# Patient Record
Sex: Male | Born: 1975 | Race: Black or African American | Hispanic: No | Marital: Single | Smoking: Current every day smoker
Health system: Southern US, Community
[De-identification: ages and names within clinical notes are randomized; demographics above are authoritative.]

## PROBLEM LIST (undated history)

## (undated) ENCOUNTER — Ambulatory Visit (HOSPITAL_COMMUNITY): Admission: EM | Payer: BC Managed Care – PPO

## (undated) DIAGNOSIS — A599 Trichomoniasis, unspecified: Secondary | ICD-10-CM

## (undated) DIAGNOSIS — J45909 Unspecified asthma, uncomplicated: Secondary | ICD-10-CM

---

## 1998-01-22 ENCOUNTER — Emergency Department (HOSPITAL_COMMUNITY): Admission: EM | Admit: 1998-01-22 | Discharge: 1998-01-22 | Payer: Self-pay | Admitting: Emergency Medicine

## 1998-03-15 ENCOUNTER — Emergency Department (HOSPITAL_COMMUNITY): Admission: EM | Admit: 1998-03-15 | Discharge: 1998-03-15 | Payer: Self-pay | Admitting: Emergency Medicine

## 1998-03-17 ENCOUNTER — Emergency Department (HOSPITAL_COMMUNITY): Admission: EM | Admit: 1998-03-17 | Discharge: 1998-03-17 | Payer: Self-pay | Admitting: Emergency Medicine

## 2000-05-11 ENCOUNTER — Emergency Department (HOSPITAL_COMMUNITY): Admission: EM | Admit: 2000-05-11 | Discharge: 2000-05-11 | Payer: Self-pay | Admitting: Emergency Medicine

## 2000-08-20 ENCOUNTER — Emergency Department (HOSPITAL_COMMUNITY): Admission: EM | Admit: 2000-08-20 | Discharge: 2000-08-20 | Payer: Self-pay | Admitting: *Deleted

## 2000-08-31 ENCOUNTER — Emergency Department (HOSPITAL_COMMUNITY): Admission: EM | Admit: 2000-08-31 | Discharge: 2000-08-31 | Payer: Self-pay | Admitting: Emergency Medicine

## 2002-04-01 ENCOUNTER — Emergency Department (HOSPITAL_COMMUNITY): Admission: EM | Admit: 2002-04-01 | Discharge: 2002-04-01 | Payer: Self-pay | Admitting: Emergency Medicine

## 2004-12-08 ENCOUNTER — Emergency Department (HOSPITAL_COMMUNITY): Admission: EM | Admit: 2004-12-08 | Discharge: 2004-12-08 | Payer: Self-pay | Admitting: Emergency Medicine

## 2005-02-06 ENCOUNTER — Emergency Department (HOSPITAL_COMMUNITY): Admission: EM | Admit: 2005-02-06 | Discharge: 2005-02-06 | Payer: Self-pay | Admitting: Emergency Medicine

## 2006-02-23 ENCOUNTER — Emergency Department (HOSPITAL_COMMUNITY): Admission: EM | Admit: 2006-02-23 | Discharge: 2006-02-23 | Payer: Self-pay | Admitting: Family Medicine

## 2006-03-23 ENCOUNTER — Emergency Department (HOSPITAL_COMMUNITY): Admission: EM | Admit: 2006-03-23 | Discharge: 2006-03-23 | Payer: Self-pay | Admitting: Family Medicine

## 2006-12-07 ENCOUNTER — Emergency Department (HOSPITAL_COMMUNITY): Admission: EM | Admit: 2006-12-07 | Discharge: 2006-12-07 | Payer: Self-pay | Admitting: Family Medicine

## 2008-06-05 ENCOUNTER — Emergency Department (HOSPITAL_COMMUNITY): Admission: EM | Admit: 2008-06-05 | Discharge: 2008-06-05 | Payer: Self-pay | Admitting: Family Medicine

## 2008-07-29 ENCOUNTER — Emergency Department (HOSPITAL_COMMUNITY): Admission: EM | Admit: 2008-07-29 | Discharge: 2008-07-29 | Payer: Self-pay | Admitting: Family Medicine

## 2008-08-02 ENCOUNTER — Emergency Department (HOSPITAL_COMMUNITY): Admission: EM | Admit: 2008-08-02 | Discharge: 2008-08-02 | Payer: Self-pay | Admitting: Emergency Medicine

## 2008-08-04 ENCOUNTER — Emergency Department (HOSPITAL_COMMUNITY): Admission: EM | Admit: 2008-08-04 | Discharge: 2008-08-04 | Payer: Self-pay | Admitting: Emergency Medicine

## 2008-10-02 ENCOUNTER — Emergency Department (HOSPITAL_COMMUNITY): Admission: EM | Admit: 2008-10-02 | Discharge: 2008-10-02 | Payer: Self-pay | Admitting: Emergency Medicine

## 2008-12-25 ENCOUNTER — Emergency Department (HOSPITAL_COMMUNITY): Admission: EM | Admit: 2008-12-25 | Discharge: 2008-12-25 | Payer: Self-pay | Admitting: Emergency Medicine

## 2010-05-17 ENCOUNTER — Emergency Department (HOSPITAL_COMMUNITY): Admission: EM | Admit: 2010-05-17 | Discharge: 2010-05-17 | Payer: Self-pay | Admitting: Emergency Medicine
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
## 2010-10-10 LAB — POCT URINALYSIS DIP (DEVICE)
Glucose, UA: NEGATIVE mg/dL
Hgb urine dipstick: NEGATIVE
Nitrite: NEGATIVE
Protein, ur: >=300 mg/dL — AB
Urobilinogen, UA: 1 mg/dL (ref 0.0–1.0)

## 2011-08-19 ENCOUNTER — Emergency Department (HOSPITAL_COMMUNITY)
Admission: EM | Admit: 2011-08-19 | Discharge: 2011-08-19 | Disposition: A | Discharge status: Home / Self Care | Payer: Self-pay | Source: Home / Self Care | Attending: Emergency Medicine | Admitting: Emergency Medicine

## 2011-08-19 ENCOUNTER — Encounter (HOSPITAL_COMMUNITY): Payer: Self-pay | Admitting: *Deleted

## 2011-08-19 DIAGNOSIS — Z202 Contact with and (suspected) exposure to infections with a predominantly sexual mode of transmission: Secondary | ICD-10-CM

## 2011-08-19 DIAGNOSIS — N481 Balanitis: Secondary | ICD-10-CM

## 2011-08-19 DIAGNOSIS — N476 Balanoposthitis: Secondary | ICD-10-CM

## 2011-08-19 HISTORY — DX: Trichomoniasis, unspecified: A59.9

## 2011-08-19 LAB — RPR: RPR Ser Ql: NONREACTIVE

## 2011-08-19 NOTE — ED Notes (Addendum)
Per pt girlfriend checked yesterday for STD treated pending results - pt wants to be checked std unprotected intercourse - denies penile discharge or urinary symptoms

## 2011-08-19 NOTE — ED Provider Notes (Signed)
Chief Complaint  Patient presents with  . SEXUALLY TRANSMITTED DISEASE    History of Present Illness:  Last week, the patient's girlfriend was diagnosed as having some abnormal cells in her cervix. She was tested for Chlamydia and gonorrhea and treated for these as well. As far as the patient knows her test did not come back positive. He himself has no symptoms including no urethral discharge, dysuria, or penile pain. He has a couple of red bumps on his penis have been there for months. They're not painful or irritated. He denies any blisters or ulcerations. No inguinal adenopathy. He's had no fever, chills, sore throat, skin rash, or joint pain. No prior history of STDs in the past.  Review of Systems:  Other than noted above, the patient denies any of the following symptoms: Systemic:  No fevers chills, aches, weight loss, arthralgias, myalgias, or adenopathy. GI:  No abdominal pain, nausea or vomiting. GU:  No dysuria, penile pain, discharge, itching, dysuria, genital lesions, testicular pain or swelling. Skin:  No rash or itching.  PMFSH:  Past medical history, family history, social history, meds, and allergies were reviewed.  Physical Exam:   Vital signs:  BP 127/77  Pulse 82  Temp(Src) 98.6 F (37 C) (Oral)  Resp 14  SpO2 100% Gen:  Alert, oriented, in no distress. Abdomen:  Soft and flat, non-distended, and non-tender.  No organomegaly or mass. Genital:  He has to small, round, slightly raised, plaque-like lesions on the glans penis. There were no ulcerations or blisters. No urethral discharge or penile pain. There was no inguinal adenopathy. No testicular tenderness, swelling, or mass. Skin:  Warm and dry.  No rash.    Assessment:   Diagnoses that have been ruled out:  None  Diagnoses that are still under consideration:  None  Final diagnoses:  Exposure to STD  Balanitis    Plan:   1.  The following meds were prescribed:   New Prescriptions   No medications on file    2.  The patient was instructed in symptomatic care and handouts were given. 3.  The patient was told to return if becoming worse in any way, if no better in 3 or 4 days, and given some red flag symptoms that would indicate earlier return. 4.  The patient was instructed to inform all sexual contacts, avoid intercourse completely for 2 weeks and then only with a condom.  The patient was told that we would call about all abnormal lab results, and that we would need to report certain kinds of infection to the health department.    Roque Lias, MD 08/19/11 [phone]

## 2011-08-19 NOTE — Discharge Instructions (Signed)
Balanitis Balanitis is an common infection of the head (glans) of the penis. CAUSES  Balanitis has multiple causes. Frequently balanitis is the result of poor personal hygiene. Especially if no circumcision has been done. Without adequate washing, many different kinds of germs (viruses, bacteria, and yeast) collect between the foreskin and the glans. This can cause an infection. Lack of air and irritation from a normal secretion called smegma contribute to the cause in uncircumcised males. Other causes include chemical irritation by certain soaps (especially soaps with perfumes). When no circumcision has been done, a frequent cause of poor hygiene is that the tip of the foreskin is tight (phimosis) and cannot be pulled back for adequate washing. Illnesses in other areas of the body can also cause balanitis. This includes illnesses that cause water retention and swelling, such as:  Heart failure.   Cirrhosis of the liver.   Kidney problems.  Other contributing causes include:  Obesity.   Certain allergies to drugs such as tetracycline and sulfa.   Diabetes.  SYMPTOMS  Symptoms may include:  Discharge coming from under the foreskin.   Tenderness.   Itching and inability to get an erection (because of the pain).   Redness and a rash is frequently seen.   If the problem remains for a while, sores can be seen on the glans and on the foreskin.  If the condition is not treated other complications such as a scar of the opening to the urethra (tube that carries the urine out from the bladder) can occur and block the bladder. This narrowing is called meatal stenosis. Other problems can occur such as:  Infection of the lymph nodes in the crease of the groin.   Ballooning of the foreskin when voiding (when the foreskin opening has scarred down and been made smaller).   Blockage of the bladder.   Frequent urinary infections occur in children with balanitis.  HOME CARE INSTRUCTIONS   Pull  back foreskin to urinate and when washing.   Pull back foreskin when putting medication on the affected area to prevent the foreskin from swelling and being trapped behind the head.   Keep foreskin and glans clean and dry.   Sitz baths may be helpful.   Take your medication as directed .   Pain medication, if needed.   Circumcision (may be recommended).  SEEK IMMEDIATE MEDICAL CARE IF:   The affected area becomes trapped behind the head.   You start a fever.   The swelling increases.  Document Released: 11/05/2008 Document Revised: 03/01/2011 Document Reviewed: 11/05/2008 [facility] Patient Information 2012 [place], [place].  You have been diagnosed with a possible STD.  Your results should be back in 3 days.  You can call us here at [phone] and ask for [facility].  She can tell you whether or not your results are back, but you must come here to get your results.  We do this to protect our patients' confidentiality.  You can come Monday through Friday and tell the receptionist that your are just here to get test results.  In the meantime, you should avoid intercourse altogether for 1 week.  After that, you should always use condoms--100% of the time.  This will not only prevent pregnancy, but has been shown to prevent HIV, syphilis, gonorrhea, chlamydia, hepatis C and other STDs.  If your test comes back positive, we are required by law to report it to the Health Department.  We also suggest you inform your partner or partners so they  can get tested and treated as well.

## 2011-08-21 LAB — GC/CHLAMYDIA PROBE AMP, GENITAL: GC Probe Amp, Genital: NEGATIVE

## 2012-07-31 ENCOUNTER — Emergency Department (HOSPITAL_COMMUNITY)
Admission: EM | Admit: 2012-07-31 | Discharge: 2012-07-31 | Disposition: A | Discharge status: Home / Self Care | Payer: Self-pay | Attending: Emergency Medicine | Admitting: Emergency Medicine

## 2012-07-31 ENCOUNTER — Encounter (HOSPITAL_COMMUNITY): Payer: Self-pay

## 2012-07-31 DIAGNOSIS — IMO0002 Reserved for concepts with insufficient information to code with codable children: Secondary | ICD-10-CM | POA: Insufficient documentation

## 2012-07-31 DIAGNOSIS — Z8619 Personal history of other infectious and parasitic diseases: Secondary | ICD-10-CM | POA: Insufficient documentation

## 2012-07-31 DIAGNOSIS — Y92410 Unspecified street and highway as the place of occurrence of the external cause: Secondary | ICD-10-CM | POA: Insufficient documentation

## 2012-07-31 DIAGNOSIS — M62838 Other muscle spasm: Secondary | ICD-10-CM

## 2012-07-31 DIAGNOSIS — F17200 Nicotine dependence, unspecified, uncomplicated: Secondary | ICD-10-CM | POA: Insufficient documentation

## 2012-07-31 DIAGNOSIS — Y9389 Activity, other specified: Secondary | ICD-10-CM | POA: Insufficient documentation

## 2012-07-31 MED ORDER — CYCLOBENZAPRINE HCL 10 MG PO TABS: 10.0000 mg | ORAL_TABLET | Freq: Two times a day (BID) | ORAL | Status: DC | PRN

## 2012-07-31 MED ORDER — HYDROCODONE-ACETAMINOPHEN 5-325 MG PO TABS: 1.0000 | ORAL_TABLET | ORAL | Status: DC | PRN

## 2012-07-31 MED ORDER — IBUPROFEN 800 MG PO TABS: 800.0000 mg | ORAL_TABLET | Freq: Three times a day (TID) | ORAL | Status: DC
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
## 2012-07-31 NOTE — ED Provider Notes (Signed)
Medical screening examination/treatment/procedure(s) were performed by non-physician practitioner and as supervising physician I was immediately available for consultation/collaboration.   Iman Reinertsen L Glendene Wyer, MD 07/31/12 1809 

## 2013-09-02 ENCOUNTER — Encounter (HOSPITAL_COMMUNITY): Payer: Self-pay | Admitting: Emergency Medicine

## 2013-09-02 ENCOUNTER — Emergency Department (HOSPITAL_COMMUNITY): Payer: Self-pay

## 2013-09-02 ENCOUNTER — Emergency Department (HOSPITAL_COMMUNITY)
Admission: EM | Admit: 2013-09-02 | Discharge: 2013-09-02 | Disposition: A | Discharge status: Home / Self Care | Payer: No Typology Code available for payment source | Attending: Emergency Medicine | Admitting: Emergency Medicine

## 2013-09-02 DIAGNOSIS — M549 Dorsalgia, unspecified: Secondary | ICD-10-CM

## 2013-09-02 DIAGNOSIS — F17200 Nicotine dependence, unspecified, uncomplicated: Secondary | ICD-10-CM | POA: Insufficient documentation

## 2013-09-02 DIAGNOSIS — Z791 Long term (current) use of non-steroidal anti-inflammatories (NSAID): Secondary | ICD-10-CM | POA: Insufficient documentation

## 2013-09-02 DIAGNOSIS — Y9389 Activity, other specified: Secondary | ICD-10-CM | POA: Insufficient documentation

## 2013-09-02 DIAGNOSIS — Y92410 Unspecified street and highway as the place of occurrence of the external cause: Secondary | ICD-10-CM | POA: Insufficient documentation

## 2013-09-02 DIAGNOSIS — IMO0002 Reserved for concepts with insufficient information to code with codable children: Secondary | ICD-10-CM | POA: Insufficient documentation

## 2013-09-02 DIAGNOSIS — Z8619 Personal history of other infectious and parasitic diseases: Secondary | ICD-10-CM | POA: Insufficient documentation

## 2013-09-02 MED ORDER — CYCLOBENZAPRINE HCL 10 MG PO TABS: 10.0000 mg | ORAL_TABLET | Freq: Two times a day (BID) | ORAL | Status: DC | PRN
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
## 2013-09-02 NOTE — Discharge Instructions (Signed)
Back Pain, Adult Low back pain is very common. About 1 in 5 people have back pain.The cause of low back pain is rarely dangerous. The pain often gets better over time.About half of people with a sudden onset of back pain feel better in just 2 weeks. About 8 in 10 people feel better by 6 weeks.  CAUSES Some common causes of back pain include:  Strain of the muscles or ligaments supporting the spine.  Wear and tear (degeneration) of the spinal discs.  Arthritis.  Direct injury to the back. DIAGNOSIS Most of the time, the direct cause of low back pain is not known.However, back pain can be treated effectively even when the exact cause of the pain is unknown.Answering your caregiver's questions about your overall health and symptoms is one of the most accurate ways to make sure the cause of your pain is not dangerous. If your caregiver needs more information, he or she may order lab work or imaging tests (X-rays or MRIs).However, even if imaging tests show changes in your back, this usually does not require surgery. HOME CARE INSTRUCTIONS For many people, back pain returns.Since low back pain is rarely dangerous, it is often a condition that people can learn to manageon their own.   Remain active. It is stressful on the back to sit or stand in one place. Do not sit, drive, or stand in one place for more than 30 minutes at a time. Take short walks on level surfaces as soon as pain allows.Try to increase the length of time you walk each day.  Do not stay in bed.Resting more than 1 or 2 days can delay your recovery.  Do not avoid exercise or work.Your body is made to move.It is not dangerous to be active, even though your back may hurt.Your back will likely heal faster if you return to being active before your pain is gone.  Pay attention to your body when you bend and lift. Many people have less discomfortwhen lifting if they bend their knees, keep the load close to their bodies,and  avoid twisting. Often, the most comfortable positions are those that put less stress on your recovering back.  Find a comfortable position to sleep. Use a firm mattress and lie on your side with your knees slightly bent. If you lie on your back, put a pillow under your knees.  Only take over-the-counter or prescription medicines as directed by your caregiver. Over-the-counter medicines to reduce pain and inflammation are often the most helpful.Your caregiver may prescribe muscle relaxant drugs.These medicines help dull your pain so you can more quickly return to your normal activities and healthy exercise.  Put ice on the injured area.  Put ice in a plastic bag.  Place a towel between your skin and the bag.  Leave the ice on for 15-20 minutes, 03-04 times a day for the first 2 to 3 days. After that, ice and heat may be alternated to reduce pain and spasms.  Ask your caregiver about trying back exercises and gentle massage. This may be of some benefit.  Avoid feeling anxious or stressed.Stress increases muscle tension and can worsen back pain.It is important to recognize when you are anxious or stressed and learn ways to manage it.Exercise is a great option. SEEK MEDICAL CARE IF:  You have pain that is not relieved with rest or medicine.  You have pain that does not improve in 1 week.  You have new symptoms.  You are generally not feeling well. SEEK   IMMEDIATE MEDICAL CARE IF:   You have pain that radiates from your back into your legs.  You develop new bowel or bladder control problems.  You have unusual weakness or numbness in your arms or legs.  You develop nausea or vomiting.  You develop abdominal pain.  You feel faint. Document Released: 06/19/2005 Document Revised: 12/19/2011 Document Reviewed: 11/07/2010 [employer] Patient Information 2014 [employer].  

## 2013-09-02 NOTE — ED Provider Notes (Signed)
Medical screening examination/treatment/procedure(s) were performed by non-physician practitioner and as supervising physician I was immediately available for consultation/collaboration.   EKG Interpretation None       Vickie Ponds, MD 09/02/13 1616 
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
## 2015-01-20 ENCOUNTER — Emergency Department (HOSPITAL_COMMUNITY)
Admission: EM | Admit: 2015-01-20 | Discharge: 2015-01-20 | Disposition: A | Discharge status: Home / Self Care | Payer: Self-pay | Attending: Emergency Medicine | Admitting: Emergency Medicine

## 2015-01-20 ENCOUNTER — Encounter (HOSPITAL_COMMUNITY): Payer: Self-pay | Admitting: *Deleted

## 2015-01-20 DIAGNOSIS — Z720 Tobacco use: Secondary | ICD-10-CM | POA: Insufficient documentation

## 2015-01-20 DIAGNOSIS — Z8619 Personal history of other infectious and parasitic diseases: Secondary | ICD-10-CM | POA: Insufficient documentation

## 2015-01-20 DIAGNOSIS — G8929 Other chronic pain: Secondary | ICD-10-CM | POA: Insufficient documentation

## 2015-01-20 DIAGNOSIS — Y93F2 Activity, caregiving, lifting: Secondary | ICD-10-CM | POA: Insufficient documentation

## 2015-01-20 DIAGNOSIS — M62838 Other muscle spasm: Secondary | ICD-10-CM | POA: Insufficient documentation

## 2015-01-20 DIAGNOSIS — S3992XA Unspecified injury of lower back, initial encounter: Secondary | ICD-10-CM | POA: Insufficient documentation

## 2015-01-20 DIAGNOSIS — X58XXXA Exposure to other specified factors, initial encounter: Secondary | ICD-10-CM | POA: Insufficient documentation

## 2015-01-20 DIAGNOSIS — Y9289 Other specified places as the place of occurrence of the external cause: Secondary | ICD-10-CM | POA: Insufficient documentation

## 2015-01-20 DIAGNOSIS — Y990 Civilian activity done for income or pay: Secondary | ICD-10-CM | POA: Insufficient documentation

## 2015-01-20 MED ORDER — DIAZEPAM 10 MG PO TABS: 10.0000 mg | ORAL_TABLET | Freq: Three times a day (TID) | ORAL | Status: DC | PRN

## 2015-01-20 NOTE — ED Notes (Signed)
Pt complains of 10/10 back pain since lifting a box 1.5 weeks ago. Pt states he lifted the box and twisted his torso at the same time. Pt states he took ibuprofen , which initially helped but no longer provides relief.

## 2015-01-20 NOTE — Discharge Instructions (Signed)
Continue to take ibuprofen, 400 mg 3 times a day. This should help your pain. Also, use heat on your back where it hurts, several times each day. Begin a gentle exercise program, to strengthen your back. You will likely need to see a specialist and have some physical therapy to improve your condition.   Heat Therapy Heat therapy can help ease sore, stiff, injured, and tight muscles and joints. Heat relaxes your muscles, which may help ease your pain.  RISKS AND COMPLICATIONS If you have any of the following conditions, do not use heat therapy unless your health care provider has approved:  Poor circulation.  Healing wounds or scarred skin in the area being treated.  Diabetes, heart disease, or high blood pressure.  Not being able to feel (numbness) the area being treated.  Unusual swelling of the area being treated.  Active infections.  Blood clots.  Cancer.  Inability to communicate pain. This may include young children and people who have problems with their brain function (dementia).  Pregnancy. Heat therapy should only be used on old, pre-existing, or long-lasting (chronic) injuries. Do not use heat therapy on new injuries unless directed by your health care provider. HOW TO USE HEAT THERAPY There are several different kinds of heat therapy, including:  Moist heat pack.  Warm water bath.  Hot water bottle.  Electric heating pad.  Heated gel pack.  Heated wrap.  Electric heating pad. Use the heat therapy method suggested by your health care provider. Follow your health care provider's instructions on when and how to use heat therapy. GENERAL HEAT THERAPY RECOMMENDATIONS  Do not sleep while using heat therapy. Only use heat therapy while you are awake.  Your skin may turn pink while using heat therapy. Do not use heat therapy if your skin turns red.  Do not use heat therapy if you have new pain.  High heat or long exposure to heat can cause burns. Be  careful when using heat therapy to avoid burning your skin.  Do not use heat therapy on areas of your skin that are already irritated, such as with a rash or sunburn. SEEK MEDICAL CARE IF:  You have blisters, redness, swelling, or numbness.  You have new pain.  Your pain is worse. MAKE SURE YOU:  Understand these instructions.  Will watch your condition.  Will get help right away if you are not doing well or get worse. Document Released: 09/11/2011 Document Revised: 11/03/2013 Document Reviewed: 08/12/2013 [facility] Patient Information 2015 [place], [place]. This information is not intended to replace advice given to you by your health care provider. Make sure you discuss any questions you have with your health care provider.  Back Exercises Back exercises help treat and prevent back injuries. The goal of back exercises is to increase the strength of your abdominal and back muscles and the flexibility of your back. These exercises should be started when you no longer have back pain. Back exercises include:  Pelvic Tilt. Lie on your back with your knees bent. Tilt your pelvis until the lower part of your back is against the floor. Hold this position 5 to 10 sec and repeat 5 to 10 times.  Knee to Chest. Pull first 1 knee up against your chest and hold for 20 to 30 seconds, repeat this with the other knee, and then both knees. This may be done with the other leg straight or bent, whichever feels better.  Sit-Ups or Curl-Ups. Bend your knees 90 degrees. Start with tilting  your pelvis, and do a partial, slow sit-up, lifting your trunk only 30 to 45 degrees off the floor. Take at least 2 to 3 seconds for each sit-up. Do not do sit-ups with your knees out straight. If partial sit-ups are difficult, simply do the above but with only tightening your abdominal muscles and holding it as directed.  Hip-Lift. Lie on your back with your knees flexed 90 degrees. Push down with your feet and shoulders as  you raise your hips a couple inches off the floor; hold for 10 seconds, repeat 5 to 10 times.  Back arches. Lie on your stomach, propping yourself up on bent elbows. Slowly press on your hands, causing an arch in your low back. Repeat 3 to 5 times. Any initial stiffness and discomfort should lessen with repetition over time.  Shoulder-Lifts. Lie face down with arms beside your body. Keep hips and torso pressed to floor as you slowly lift your head and shoulders off the floor. Do not overdo your exercises, especially in the beginning. Exercises may cause you some mild back discomfort which lasts for a few minutes; however, if the pain is more severe, or lasts for more than 15 minutes, do not continue exercises until you see your caregiver. Improvement with exercise therapy for back problems is slow.  See your caregivers for assistance with developing a proper back exercise program. Document Released: 07/27/2004 Document Revised: 09/11/2011 Document Reviewed: 04/20/2011 [facility] Patient Information 2015 [place]. This information is not intended to replace advice given to you by your health care provider. Make sure you discuss any questions you have with your health care provider.
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
## 2015-02-02 ENCOUNTER — Emergency Department (HOSPITAL_COMMUNITY)
Admission: EM | Admit: 2015-02-02 | Discharge: 2015-02-02 | Disposition: A | Discharge status: Home / Self Care | Payer: Self-pay | Attending: Physician Assistant | Admitting: Physician Assistant

## 2015-02-02 ENCOUNTER — Encounter (HOSPITAL_COMMUNITY): Payer: Self-pay | Admitting: Emergency Medicine

## 2015-02-02 DIAGNOSIS — Z8619 Personal history of other infectious and parasitic diseases: Secondary | ICD-10-CM | POA: Insufficient documentation

## 2015-02-02 DIAGNOSIS — M5431 Sciatica, right side: Secondary | ICD-10-CM | POA: Insufficient documentation

## 2015-02-02 MED ORDER — PREDNISONE 20 MG PO TABS: 40.0000 mg | ORAL_TABLET | Freq: Every day | ORAL | Status: DC

## 2015-02-02 NOTE — ED Notes (Signed)
Patient states he has right hip pain X 1 week, pain radiates down his thigh.  He denies any numbness or weakness.  He denies any recent injury or fall.  He has increased pain when bending over to pick up objects.

## 2015-02-02 NOTE — ED Provider Notes (Signed)
CSN: [id number]     Arrival date & time 02/02/15  1213 History  This chart was scribed for non-physician practitioner, Roxy Horseman, PA-C working with Abelino Derrick, MD, by Jarvis Morgan, ED Scribe. This patient was seen in room WTR7/WTR7 and the patient's care was started at 12:41 PM.    Chief Complaint  Patient presents with  . Hip Pain    The history is provided by the patient. No language interpreter was used.    Johnny Cooke is a 39 y.o. male who presents to the Emergency Department with a chief complaint of constant, moderate, right hip pain onset 1 week. He states the pain radiates down into his right thigh. Pt reports he was in the ER on July 20th for lower back pain and states it has now moved into his right hip. He notes the pain is worse with movement or bending over. Pt denies any direct injury or fall. When pt was seen on in the ER in July he was referred to a physical therapist but states he has not been able to follow up yet due to insurance bu plans to go in the coming months. Pt denies h/o diabetes. He denies any numbness, weakness, gait issue, urinary or bowel incontinence or fevers.  Past Medical History  Diagnosis Date  . Trichimoniasis    History reviewed. No pertinent past surgical history. History reviewed. No pertinent family history. History  Substance Use Topics  . Smoking status: Current Some Day Smoker    Types: Cigars  . Smokeless tobacco: Not on file  . Alcohol Use: No    Review of Systems  Constitutional: Negative for fever.  Gastrointestinal:       Negative for bowel  incontinence  Genitourinary:       Negative for urinary incontinence  Musculoskeletal: Positive for back pain and arthralgias. Negative for gait problem.  Neurological: Negative for weakness and numbness.      Allergies  Review of patient's allergies indicates no known allergies.  Home Medications   Prior to Admission medications   Medication Sig Start Date End Date  Taking? Authorizing Provider  cyclobenzaprine (FLEXERIL) 10 MG tablet Take 1 tablet (10 mg total) by mouth 2 (two) times daily as needed for muscle spasms. 09/02/13   Roxy Horseman, PA-C  diazepam (VALIUM) 10 MG tablet Take 1 tablet (10 mg total) by mouth every 8 (eight) hours as needed (muscle spasm). 01/20/15   Mancel Bale, MD  ibuprofen (ADVIL,MOTRIN) 200 MG tablet Take 400 mg by mouth every 6 (six) hours as needed for fever, headache or mild pain.    Historical Provider, MD   Triage Vitals: BP 106/72 mmHg  Pulse 82  Temp(Src) 97.5 F (36.4 C) (Oral)  Resp 16  Ht 5\' 9"  (1.753 m)  Wt 165 lb (74.844 kg)  BMI 24.36 kg/m2  SpO2 99%  Physical Exam  Constitutional: He is oriented to person, place, and time. He appears well-developed and well-nourished. No distress.  HENT:  Head: Normocephalic and atraumatic.  Eyes: Conjunctivae and EOM are normal. Right eye exhibits no discharge. Left eye exhibits no discharge. No scleral icterus.  Neck: Normal range of motion. Neck supple. No tracheal deviation present.  Cardiovascular: Normal rate, regular rhythm and normal heart sounds.  Exam reveals no gallop and no friction rub.   No murmur heard. Pulmonary/Chest: Effort normal and breath sounds normal. No respiratory distress. He has no wheezes.  Abdominal: Soft. He exhibits no distension. There is no tenderness.  Musculoskeletal: Normal  range of motion.  Lumbar paraspinal muscles tender to palpation, no bony tenderness, step-offs, or gross abnormality or deformity of spine, patient is able to ambulate, moves all extremities  Bilateral great toe extension intact Bilateral plantar/dorsiflexion intact  Neurological: He is alert and oriented to person, place, and time. He has normal reflexes.  Sensation and strength intact bilaterally Symmetrical reflexes  Skin: Skin is warm and dry. He is not diaphoretic.  Psychiatric: He has a normal mood and affect. His behavior is normal. Judgment and thought  content normal.  Nursing note and vitals reviewed.   ED Course  Procedures (including critical care time)  DIAGNOSTIC STUDIES: Oxygen Saturation is 99% on RA, normal by my interpretation.    COORDINATION OF CARE: 12:50 PM- Advised pt to follow up with PT when he can and in the mean time to research back exercises. Told pt that if any exercise hurts to not do it. Also advised easier practices for lifting to help with the pain. Will prescribe prednisone for symptoms. Pt advised of plan for treatment and pt agrees.     Labs Review Labs Reviewed - No data to display  Imaging Review No results found.   EKG Interpretation None      MDM   Final diagnoses:  Sciatica, right   Patient with back pain.  No neurological deficits and normal neuro exam.  Patient is ambulatory.  No loss of bowel or bladder control.  Doubt cauda equina.  Denies fever,  doubt epidural abscess or other lesion. Recommend back exercises, stretching, RICE, and will treat with a short course of prednisone.  Encouraged the patient that there could be a need for additional workup and/or imaging such as MRI, if the symptoms do not resolve. Patient advised that if the back pain does not resolve, or radiates, this could progress to more serious conditions and is encouraged to follow-up with PCP or orthopedics within 2 weeks.    I personally performed the services described in this documentation, which was scribed in my presence. The recorded information has been reviewed and is accurate.      Roxy Horseman, PA-C 02/02/15 1419  Courteney Lyn Mackuen, MD 02/02/15 1500

## 2015-02-02 NOTE — Discharge Instructions (Signed)
Sciatica with Rehab The sciatic nerve runs from the back down the leg and is responsible for sensation and control of the muscles in the back (posterior) side of the thigh, lower leg, and foot. Sciatica is a condition that is characterized by inflammation of this nerve.  SYMPTOMS   Signs of nerve damage, including numbness and/or weakness along the posterior side of the lower extremity.  Pain in the back of the thigh that may also travel down the leg.  Pain that worsens when sitting for long periods of time.  Occasionally, pain in the back or buttock. CAUSES  Inflammation of the sciatic nerve is the cause of sciatica. The inflammation is due to something irritating the nerve. Common sources of irritation include:  Sitting for long periods of time.  Direct trauma to the nerve.  Arthritis of the spine.  Herniated or ruptured disk.  Slipping of the vertebrae (spondylolisthesis).  Pressure from soft tissues, such as muscles or ligament-like tissue (fascia). RISK INCREASES WITH:  Sports that place pressure or stress on the spine (football or weightlifting).  Poor strength and flexibility.  Failure to warm up properly before activity.  Family history of low back pain or disk disorders.  Previous back injury or surgery.  Poor body mechanics, especially when lifting, or poor posture. PREVENTION   Warm up and stretch properly before activity.  Maintain physical fitness:  Strength, flexibility, and endurance.  Cardiovascular fitness.  Learn and use proper technique, especially with posture and lifting. When possible, have coach correct improper technique.  Avoid activities that place stress on the spine. PROGNOSIS If treated properly, then sciatica usually resolves within 6 weeks. However, occasionally surgery is necessary.  RELATED COMPLICATIONS   Permanent nerve damage, including pain, numbness, tingle, or weakness.  Chronic back pain.  Risks of surgery: infection,  bleeding, nerve damage, or damage to surrounding tissues. TREATMENT Treatment initially involves resting from any activities that aggravate your symptoms. The use of ice and medication may help reduce pain and inflammation. The use of strengthening and stretching exercises may help reduce pain with activity. These exercises may be performed at home or with referral to a therapist. A therapist may recommend further treatments, such as transcutaneous electronic nerve stimulation (TENS) or ultrasound. Your caregiver may recommend corticosteroid injections to help reduce inflammation of the sciatic nerve. If symptoms persist despite non-surgical (conservative) treatment, then surgery may be recommended. MEDICATION  If pain medication is necessary, then nonsteroidal anti-inflammatory medications, such as aspirin and ibuprofen, or other minor pain relievers, such as acetaminophen, are often recommended.  Do not take pain medication for 7 days before surgery.  Prescription pain relievers may be given if deemed necessary by your caregiver. Use only as directed and only as much as you need.  Ointments applied to the skin may be helpful.  Corticosteroid injections may be given by your caregiver. These injections should be reserved for the most serious cases, because they may only be given a certain number of times. HEAT AND COLD  Cold treatment (icing) relieves pain and reduces inflammation. Cold treatment should be applied for 10 to 15 minutes every 2 to 3 hours for inflammation and pain and immediately after any activity that aggravates your symptoms. Use ice packs or massage the area with a piece of ice (ice massage).  Heat treatment may be used prior to performing the stretching and strengthening activities prescribed by your caregiver, physical therapist, or athletic trainer. Use a heat pack or soak the injury in warm water.   SEEK MEDICAL CARE IF:  Treatment seems to offer no benefit, or the condition  worsens.  Any medications produce adverse side effects. EXERCISES  RANGE OF MOTION (ROM) AND STRETCHING EXERCISES - Sciatica Most people with sciatic will find that their symptoms worsen with either excessive bending forward (flexion) or arching at the low back (extension). The exercises which will help resolve your symptoms will focus on the opposite motion. Your physician, physical therapist or athletic trainer will help you determine which exercises will be most helpful to resolve your low back pain. Do not complete any exercises without first consulting with your clinician. Discontinue any exercises which worsen your symptoms until you speak to your clinician. If you have pain, numbness or tingling which travels down into your buttocks, leg or foot, the goal of the therapy is for these symptoms to move closer to your back and eventually resolve. Occasionally, these leg symptoms will get better, but your low back pain may worsen; this is typically an indication of progress in your rehabilitation. Be certain to be very alert to any changes in your symptoms and the activities in which you participated in the 24 hours prior to the change. Sharing this information with your clinician will allow him/her to most efficiently treat your condition. These exercises may help you when beginning to rehabilitate your injury. Your symptoms may resolve with or without further involvement from your physician, physical therapist or athletic trainer. While completing these exercises, remember:   Restoring tissue flexibility helps normal motion to return to the joints. This allows healthier, less painful movement and activity.  An effective stretch should be held for at least 30 seconds.  A stretch should never be painful. You should only feel a gentle lengthening or release in the stretched tissue. FLEXION RANGE OF MOTION AND STRETCHING EXERCISES: STRETCH - Flexion, Single Knee to Chest   Lie on a firm bed or floor  with both legs extended in front of you.  Keeping one leg in contact with the floor, bring your opposite knee to your chest. Hold your leg in place by either grabbing behind your thigh or at your knee.  Pull until you feel a gentle stretch in your low back. Hold __________ seconds.  Slowly release your grasp and repeat the exercise with the opposite side. Repeat __________ times. Complete this exercise __________ times per day.  STRETCH - Flexion, Double Knee to Chest  Lie on a firm bed or floor with both legs extended in front of you.  Keeping one leg in contact with the floor, bring your opposite knee to your chest.  Tense your stomach muscles to support your back and then lift your other knee to your chest. Hold your legs in place by either grabbing behind your thighs or at your knees.  Pull both knees toward your chest until you feel a gentle stretch in your low back. Hold __________ seconds.  Tense your stomach muscles and slowly return one leg at a time to the floor. Repeat __________ times. Complete this exercise __________ times per day.  STRETCH - Low Trunk Rotation   Lie on a firm bed or floor. Keeping your legs in front of you, bend your knees so they are both pointed toward the ceiling and your feet are flat on the floor.  Extend your arms out to the side. This will stabilize your upper body by keeping your shoulders in contact with the floor.  Gently and slowly drop both knees together to one side until   you feel a gentle stretch in your low back. Hold for __________ seconds.  Tense your stomach muscles to support your low back as you bring your knees back to the starting position. Repeat the exercise to the other side. Repeat __________ times. Complete this exercise __________ times per day  EXTENSION RANGE OF MOTION AND FLEXIBILITY EXERCISES: STRETCH - Extension, Prone on Elbows  Lie on your stomach on the floor, a bed will be too soft. Place your palms about shoulder  width apart and at the height of your head.  Place your elbows under your shoulders. If this is too painful, stack pillows under your chest.  Allow your body to relax so that your hips drop lower and make contact more completely with the floor.  Hold this position for __________ seconds.  Slowly return to lying flat on the floor. Repeat __________ times. Complete this exercise __________ times per day.  RANGE OF MOTION - Extension, Prone Press Ups  Lie on your stomach on the floor, a bed will be too soft. Place your palms about shoulder width apart and at the height of your head.  Keeping your back as relaxed as possible, slowly straighten your elbows while keeping your hips on the floor. You may adjust the placement of your hands to maximize your comfort. As you gain motion, your hands will come more underneath your shoulders.  Hold this position __________ seconds.  Slowly return to lying flat on the floor. Repeat __________ times. Complete this exercise __________ times per day.  STRENGTHENING EXERCISES - Sciatica  These exercises may help you when beginning to rehabilitate your injury. These exercises should be done near your "sweet spot." This is the neutral, low-back arch, somewhere between fully rounded and fully arched, that is your least painful position. When performed in this safe range of motion, these exercises can be used for people who have either a flexion or extension based injury. These exercises may resolve your symptoms with or without further involvement from your physician, physical therapist or athletic trainer. While completing these exercises, remember:   Muscles can gain both the endurance and the strength needed for everyday activities through controlled exercises.  Complete these exercises as instructed by your physician, physical therapist or athletic trainer. Progress with the resistance and repetition exercises only as your caregiver advises.  You may  experience muscle soreness or fatigue, but the pain or discomfort you are trying to eliminate should never worsen during these exercises. If this pain does worsen, stop and make certain you are following the directions exactly. If the pain is still present after adjustments, discontinue the exercise until you can discuss the trouble with your clinician. STRENGTHENING - Deep Abdominals, Pelvic Tilt   Lie on a firm bed or floor. Keeping your legs in front of you, bend your knees so they are both pointed toward the ceiling and your feet are flat on the floor.  Tense your lower abdominal muscles to press your low back into the floor. This motion will rotate your pelvis so that your tail bone is scooping upwards rather than pointing at your feet or into the floor.  With a gentle tension and even breathing, hold this position for __________ seconds. Repeat __________ times. Complete this exercise __________ times per day.  STRENGTHENING - Abdominals, Crunches   Lie on a firm bed or floor. Keeping your legs in front of you, bend your knees so they are both pointed toward the ceiling and your feet are flat on the   floor. Cross your arms over your chest.  Slightly tip your chin down without bending your neck.  Tense your abdominals and slowly lift your trunk high enough to just clear your shoulder blades. Lifting higher can put excessive stress on the low back and does not further strengthen your abdominal muscles.  Control your return to the starting position. Repeat __________ times. Complete this exercise __________ times per day.  STRENGTHENING - Quadruped, Opposite UE/LE Lift  Assume a hands and knees position on a firm surface. Keep your hands under your shoulders and your knees under your hips. You may place padding under your knees for comfort.  Find your neutral spine and gently tense your abdominal muscles so that you can maintain this position. Your shoulders and hips should form a rectangle  that is parallel with the floor and is not twisted.  Keeping your trunk steady, lift your right hand no higher than your shoulder and then your left leg no higher than your hip. Make sure you are not holding your breath. Hold this position __________ seconds.  Continuing to keep your abdominal muscles tense and your back steady, slowly return to your starting position. Repeat with the opposite arm and leg. Repeat __________ times. Complete this exercise __________ times per day.  STRENGTHENING - Abdominals and Quadriceps, Straight Leg Raise   Lie on a firm bed or floor with both legs extended in front of you.  Keeping one leg in contact with the floor, bend the other knee so that your foot can rest flat on the floor.  Find your neutral spine, and tense your abdominal muscles to maintain your spinal position throughout the exercise.  Slowly lift your straight leg off the floor about 6 inches for a count of 15, making sure to not hold your breath.  Still keeping your neutral spine, slowly lower your leg all the way to the floor. Repeat this exercise with each leg __________ times. Complete this exercise __________ times per day. POSTURE AND BODY MECHANICS CONSIDERATIONS - Sciatica Keeping correct posture when sitting, standing or completing your activities will reduce the stress put on different body tissues, allowing injured tissues a chance to heal and limiting painful experiences. The following are general guidelines for improved posture. Your physician or physical therapist will provide you with any instructions specific to your needs. While reading these guidelines, remember:  The exercises prescribed by your provider will help you have the flexibility and strength to maintain correct postures.  The correct posture provides the optimal environment for your joints to work. All of your joints have less wear and tear when properly supported by a spine with good posture. This means you will  experience a healthier, less painful body.  Correct posture must be practiced with all of your activities, especially prolonged sitting and standing. Correct posture is as important when doing repetitive low-stress activities (typing) as it is when doing a single heavy-load activity (lifting). RESTING POSITIONS Consider which positions are most painful for you when choosing a resting position. If you have pain with flexion-based activities (sitting, bending, stooping, squatting), choose a position that allows you to rest in a less flexed posture. You would want to avoid curling into a fetal position on your side. If your pain worsens with extension-based activities (prolonged standing, working overhead), avoid resting in an extended position such as sleeping on your stomach. Most people will find more comfort when they rest with their spine in a more neutral position, neither too rounded nor too   arched. Lying on a non-sagging bed on your side with a pillow between your knees, or on your back with a pillow under your knees will often provide some relief. Keep in mind, being in any one position for a prolonged period of time, no matter how correct your posture, can still lead to stiffness. PROPER SITTING POSTURE In order to minimize stress and discomfort on your spine, you must sit with correct posture Sitting with good posture should be effortless for a healthy body. Returning to good posture is a gradual process. Many people can work toward this most comfortably by using various supports until they have the flexibility and strength to maintain this posture on their own. When sitting with proper posture, your ears will fall over your shoulders and your shoulders will fall over your hips. You should use the back of the chair to support your upper back. Your low back will be in a neutral position, just slightly arched. You may place a small pillow or folded towel at the base of your low back for support.  When  working at a desk, create an environment that supports good, upright posture. Without extra support, muscles fatigue and lead to excessive strain on joints and other tissues. Keep these recommendations in mind: CHAIR:   A chair should be able to slide under your desk when your back makes contact with the back of the chair. This allows you to work closely.  The chair's height should allow your eyes to be level with the upper part of your monitor and your hands to be slightly lower than your elbows. BODY POSITION  Your feet should make contact with the floor. If this is not possible, use a foot rest.  Keep your ears over your shoulders. This will reduce stress on your neck and low back. INCORRECT SITTING POSTURES   If you are feeling tired and unable to assume a healthy sitting posture, do not slouch or slump. This puts excessive strain on your back tissues, causing more damage and pain. Healthier options include:  Using more support, like a lumbar pillow.  Switching tasks to something that requires you to be upright or walking.  Talking a brief walk.  Lying down to rest in a neutral-spine position. PROLONGED STANDING WHILE SLIGHTLY LEANING FORWARD  When completing a task that requires you to lean forward while standing in one place for a long time, place either foot up on a stationary 2-4 inch high object to help maintain the best posture. When both feet are on the ground, the low back tends to lose its slight inward curve. If this curve flattens (or becomes too large), then the back and your other joints will experience too much stress, fatigue more quickly and can cause pain.  CORRECT STANDING POSTURES Proper standing posture should be assumed with all daily activities, even if they only take a few moments, like when brushing your teeth. As in sitting, your ears should fall over your shoulders and your shoulders should fall over your hips. You should keep a slight tension in your abdominal  muscles to brace your spine. Your tailbone should point down to the ground, not behind your body, resulting in an over-extended swayback posture.  INCORRECT STANDING POSTURES  Common incorrect standing postures include a forward head, locked knees and/or an excessive swayback. WALKING Walk with an upright posture. Your ears, shoulders and hips should all line-up. PROLONGED ACTIVITY IN A FLEXED POSITION When completing a task that requires you to bend forward   at your waist or lean over a low surface, try to find a way to stabilize 3 of 4 of your limbs. You can place a hand or elbow on your thigh or rest a knee on the surface you are reaching across. This will provide you more stability so that your muscles do not fatigue as quickly. By keeping your knees relaxed, or slightly bent, you will also reduce stress across your low back. CORRECT LIFTING TECHNIQUES DO :   Assume a wide stance. This will provide you more stability and the opportunity to get as close as possible to the object which you are lifting.  Tense your abdominals to brace your spine; then bend at the knees and hips. Keeping your back locked in a neutral-spine position, lift using your leg muscles. Lift with your legs, keeping your back straight.  Test the weight of unknown objects before attempting to lift them.  Try to keep your elbows locked down at your sides in order get the best strength from your shoulders when carrying an object.  Always ask for help when lifting heavy or awkward objects. INCORRECT LIFTING TECHNIQUES DO NOT:   Lock your knees when lifting, even if it is a small object.  Bend and twist. Pivot at your feet or move your feet when needing to change directions.  Assume that you cannot safely pick up a paperclip without proper posture. Document Released: 06/19/2005 Document Revised: 11/03/2013 Document Reviewed: 10/01/2008 [employer] Patient Information 2015 [employer]. This information is not intended to  replace advice given to you by your health care provider. Make sure you discuss any questions you have with your health care provider.  

## 2015-04-07 ENCOUNTER — Ambulatory Visit (INDEPENDENT_AMBULATORY_CARE_PROVIDER_SITE_OTHER): Payer: 59 | Admitting: Family Medicine

## 2015-04-07 VITALS — BP 102/68 | HR 70 | Temp 98.0°F | Resp 18 | Ht 70.0 in | Wt 157.6 lb

## 2015-04-07 DIAGNOSIS — M545 Low back pain, unspecified: Secondary | ICD-10-CM

## 2015-04-07 DIAGNOSIS — R112 Nausea with vomiting, unspecified: Secondary | ICD-10-CM | POA: Diagnosis not present

## 2015-04-07 MED ORDER — ONDANSETRON 8 MG PO TBDP: 8.0000 mg | ORAL_TABLET | Freq: Three times a day (TID) | ORAL | Status: DC | PRN

## 2015-04-07 MED ORDER — PREDNISONE 20 MG PO TABS: 40.0000 mg | ORAL_TABLET | Freq: Every day | ORAL | Status: DC

## 2015-04-07 MED ORDER — CYCLOBENZAPRINE HCL 10 MG PO TABS: 10.0000 mg | ORAL_TABLET | Freq: Two times a day (BID) | ORAL | Status: DC | PRN

## 2015-04-07 NOTE — Progress Notes (Signed)
Subjective:  This chart was scribed for Elvina Sidle MD, by Veverly Fells, at [facility].  This patient was seen in room 7 and the patient's care was started at 10:59 AM.   Chief Complaint  Patient presents with  . post hospital visit    pinched nerve in lower back extending to right leg  . Emesis  . Night Sweats    this am  . Chills    this am     Patient ID: Johnny Cooke, male    DOB: 1975/08/17, 39 y.o.   MRN: [id number]  HPI HPI Comments: Johnny Cooke is a 39 y.o. male who presents to the [facility] complaining of lower radiating (to right leg) back pain onset four months ago.  Patient went to the ER a few months ago and was told he had a pinched nerve.  He was not able to pay out of pocket (due to insurance) at the time so he was not able to get relief by physical therapy. He usually wears a back brace to work but does not have one on at the moment.  Emesis/ Diarrhea He also notes that he thinks he has food poisoning and has been vomiting with night sweats last night and chills this morning.  He has associated symptoms of diarrhea this morning and sharp intermittent abdominal pain.  He states that other individuals at his work are also sick (boss had symptoms which lasted for 12 hours). He has not had any water this morning.  Patient works at [employer] at [employer].    Review of Systems  Constitutional: Positive for chills.  Eyes: Negative for pain, redness and itching.  Respiratory: Negative for cough, choking and shortness of breath.   Gastrointestinal: Positive for nausea, vomiting, abdominal pain and diarrhea.  Musculoskeletal: Positive for back pain. Negative for neck pain and neck stiffness.  Skin: Negative for rash and wound.  Neurological: Negative for seizures, syncope and speech difficulty.       Objective:   Physical Exam  Constitutional: He is oriented to person, place, and time. He appears well-developed. No distress.    HENT:  Head: Normocephalic and atraumatic.  Eyes: Pupils are equal, round, and reactive to light.  Neck: Normal range of motion.  Cardiovascular: Normal rate.   Pulmonary/Chest: Effort normal.  Neurological: He is alert and oriented to person, place, and time.  Skin: Skin is warm.  Psychiatric: He has a normal mood and affect. His behavior is normal.  Nursing note and vitals reviewed.  Reflexes in the knees and ankles are intact Straight-leg raising: Weakly positive on the right, negative on left Inspection and palpation of lower back is normal Abdomen: Soft nontender without HSM, active bowel sounds, no mass, no guarding or rebound Heart: Regular without murmur Chest: Clear to auscultation Neurologic: Patient is alert, cooperative, normal cranial nerves III through XII, full range of motion of all 4 extremities Filed Vitals:   04/07/15 1014  BP: 102/68  Pulse: 70  Temp: 98 F (36.7 C)  TempSrc: Oral  Resp: 18  Height:  (1.778 m)  Weight: 157 lb 9.6 oz (71.487 kg)  SpO2: 99%         Assessment & Plan:   This chart was scribed in my presence and reviewed by me personally.    ICD-9-CM ICD-10-CM   1. Acute low back pain 724.2 M54.5 cyclobenzaprine (FLEXERIL) 10 MG tablet     predniSONE (DELTASONE) 20 MG tablet  Ambulatory referral to Physical Therapy  2. Non-intractable vomiting with nausea, unspecified vomiting type 787.01 R11.2 ondansetron (ZOFRAN-ODT) 8 MG disintegrating tablet     Signed, Elvina Sidle, MD

## 2015-04-07 NOTE — Patient Instructions (Signed)
Low Back Sprain With Rehab A sprain is an injury in which a ligament is torn. The ligaments of the lower back are vulnerable to sprains. However, they are strong and require great force to be injured. These ligaments are important for stabilizing the spinal column. Sprains are classified into three categories. Grade 1 sprains cause pain, but the tendon is not lengthened. Grade 2 sprains include a lengthened ligament, due to the ligament being stretched or partially ruptured. With grade 2 sprains there is still function, although the function may be decreased. Grade 3 sprains involve a complete tear of the tendon or muscle, and function is usually impaired. SYMPTOMS   Severe pain in the lower back.  Sometimes, a feeling of a "pop," "snap," or tear, at the time of injury.  Tenderness and sometimes swelling at the injury site.  Uncommonly, bruising (contusion) within 48 hours of injury.  Muscle spasms in the back. CAUSES  Low back sprains occur when a force is placed on the ligaments that is greater than they can handle. Common causes of injury include:  Performing a stressful act while off-balance.  Repetitive stressful activities that involve movement of the lower back.  Direct hit (trauma) to the lower back. RISK INCREASES WITH:  Contact sports (football, wrestling).  Collisions (major skiing accidents).  Sports that require throwing or lifting (baseball, weightlifting).  Sports involving twisting of the spine (gymnastics, diving, tennis, golf).  Poor strength and flexibility.  Inadequate protection.  Previous back injury or surgery (especially fusion). PREVENTION  Wear properly fitted and padded protective equipment.  Warm up and stretch properly before activity.  Allow for adequate recovery between workouts.  Maintain physical fitness:  Strength, flexibility, and endurance.  Cardiovascular fitness.  Maintain a healthy body weight. PROGNOSIS  If treated properly,  low back sprains usually heal with non-surgical treatment. The length of time for healing depends on the severity of the injury.  RELATED COMPLICATIONS   Recurring symptoms, resulting in a chronic problem.  Chronic inflammation and pain in the low back.  Delayed healing or resolution of symptoms, especially if activity is resumed too soon.  Prolonged impairment.  Unstable or arthritic joints of the low back. TREATMENT  Treatment first involves the use of ice and medicine, to reduce pain and inflammation. The use of strengthening and stretching exercises may help reduce pain with activity. These exercises may be performed at home or with a therapist. Severe injuries may require referral to a therapist for further evaluation and treatment, such as ultrasound. Your caregiver may advise that you wear a back brace or corset, to help reduce pain and discomfort. Often, prolonged bed rest results in greater harm then benefit. Corticosteroid injections may be recommended. However, these should be reserved for the most serious cases. It is important to avoid using your back when lifting objects. At night, sleep on your back on a firm mattress, with a pillow placed under your knees. If non-surgical treatment is unsuccessful, surgery may be needed.  MEDICATION   If pain medicine is needed, nonsteroidal anti-inflammatory medicines (aspirin and ibuprofen), or other minor pain relievers (acetaminophen), are often advised.  Do not take pain medicine for 7 days before surgery.  Prescription pain relievers may be given, if your caregiver thinks they are needed. Use only as directed and only as much as you need.  Ointments applied to the skin may be helpful.  Corticosteroid injections may be given by your caregiver. These injections should be reserved for the most serious cases, because   they may only be given a certain number of times. HEAT AND COLD  Cold treatment (icing) should be applied for 10 to 15  minutes every 2 to 3 hours for inflammation and pain, and immediately after activity that aggravates your symptoms. Use ice packs or an ice massage.  Heat treatment may be used before performing stretching and strengthening activities prescribed by your caregiver, physical therapist, or athletic trainer. Use a heat pack or a warm water soak. SEEK MEDICAL CARE IF:   Symptoms get worse or do not improve in 2 to 4 weeks, despite treatment.  You develop numbness or weakness in either leg.  You lose bowel or bladder function.  Any of the following occur after surgery: fever, increased pain, swelling, redness, drainage of fluids, or bleeding in the affected area.  New, unexplained symptoms develop. (Drugs used in treatment may produce side effects.) EXERCISES  RANGE OF MOTION (ROM) AND STRETCHING EXERCISES - Low Back Sprain Most people with lower back pain will find that their symptoms get worse with excessive bending forward (flexion) or arching at the lower back (extension). The exercises that will help resolve your symptoms will focus on the opposite motion.  Your physician, physical therapist or athletic trainer will help you determine which exercises will be most helpful to resolve your lower back pain. Do not complete any exercises without first consulting with your caregiver. Discontinue any exercises which make your symptoms worse, until you speak to your caregiver. If you have pain, numbness or tingling which travels down into your buttocks, leg or foot, the goal of the therapy is for these symptoms to move closer to your back and eventually resolve. Sometimes, these leg symptoms will get better, but your lower back pain may worsen. This is often an indication of progress in your rehabilitation. Be very alert to any changes in your symptoms and the activities in which you participated in the 24 hours prior to the change. Sharing this information with your caregiver will allow him or her to most  efficiently treat your condition. These exercises may help you when beginning to rehabilitate your injury. Your symptoms may resolve with or without further involvement from your physician, physical therapist or athletic trainer. While completing these exercises, remember:   Restoring tissue flexibility helps normal motion to return to the joints. This allows healthier, less painful movement and activity.  An effective stretch should be held for at least 30 seconds.  A stretch should never be painful. You should only feel a gentle lengthening or release in the stretched tissue. FLEXION RANGE OF MOTION AND STRETCHING EXERCISES: STRETCH - Flexion, Single Knee to Chest   Lie on a firm bed or floor with both legs extended in front of you.  Keeping one leg in contact with the floor, bring your opposite knee to your chest. Hold your leg in place by either grabbing behind your thigh or at your knee.  Pull until you feel a gentle stretch in your low back. Hold __________ seconds.  Slowly release your grasp and repeat the exercise with the opposite side. Repeat __________ times. Complete this exercise __________ times per day.  STRETCH - Flexion, Double Knee to Chest  Lie on a firm bed or floor with both legs extended in front of you.  Keeping one leg in contact with the floor, bring your opposite knee to your chest.  Tense your stomach muscles to support your back and then lift your other knee to your chest. Hold your legs in   place by either grabbing behind your thighs or at your knees.  Pull both knees toward your chest until you feel a gentle stretch in your low back. Hold __________ seconds.  Tense your stomach muscles and slowly return one leg at a time to the floor. Repeat __________ times. Complete this exercise __________ times per day.  STRETCH - Low Trunk Rotation  Lie on a firm bed or floor. Keeping your legs in front of you, bend your knees so they are both pointed toward the  ceiling and your feet are flat on the floor.  Extend your arms out to the side. This will stabilize your upper body by keeping your shoulders in contact with the floor.  Gently and slowly drop both knees together to one side until you feel a gentle stretch in your low back. Hold for __________ seconds.  Tense your stomach muscles to support your lower back as you bring your knees back to the starting position. Repeat the exercise to the other side. Repeat __________ times. Complete this exercise __________ times per day  EXTENSION RANGE OF MOTION AND FLEXIBILITY EXERCISES: STRETCH - Extension, Prone on Elbows   Lie on your stomach on the floor, a bed will be too soft. Place your palms about shoulder width apart and at the height of your head.  Place your elbows under your shoulders. If this is too painful, stack pillows under your chest.  Allow your body to relax so that your hips drop lower and make contact more completely with the floor.  Hold this position for __________ seconds.  Slowly return to lying flat on the floor. Repeat __________ times. Complete this exercise __________ times per day.  RANGE OF MOTION - Extension, Prone Press Ups  Lie on your stomach on the floor, a bed will be too soft. Place your palms about shoulder width apart and at the height of your head.  Keeping your back as relaxed as possible, slowly straighten your elbows while keeping your hips on the floor. You may adjust the placement of your hands to maximize your comfort. As you gain motion, your hands will come more underneath your shoulders.  Hold this position __________ seconds.  Slowly return to lying flat on the floor. Repeat __________ times. Complete this exercise __________ times per day.  RANGE OF MOTION- Quadruped, Neutral Spine   Assume a hands and knees position on a firm surface. Keep your hands under your shoulders and your knees under your hips. You may place padding under your knees for  comfort.  Drop your head and point your tailbone toward the ground below you. This will round out your lower back like an angry cat. Hold this position for __________ seconds.  Slowly lift your head and release your tail bone so that your back sags into a large arch, like an old horse.  Hold this position for __________ seconds.  Repeat this until you feel limber in your low back.  Now, find your "sweet spot." This will be the most comfortable position somewhere between the two previous positions. This is your neutral spine. Once you have found this position, tense your stomach muscles to support your low back.  Hold this position for __________ seconds. Repeat __________ times. Complete this exercise __________ times per day.  STRENGTHENING EXERCISES - Low Back Sprain These exercises may help you when beginning to rehabilitate your injury. These exercises should be done near your "sweet spot." This is the neutral, low-back arch, somewhere between fully rounded and   fully arched, that is your least painful position. When performed in this safe range of motion, these exercises can be used for people who have either a flexion or extension based injury. These exercises may resolve your symptoms with or without further involvement from your physician, physical therapist or athletic trainer. While completing these exercises, remember:   Muscles can gain both the endurance and the strength needed for everyday activities through controlled exercises.  Complete these exercises as instructed by your physician, physical therapist or athletic trainer. Increase the resistance and repetitions only as guided.  You may experience muscle soreness or fatigue, but the pain or discomfort you are trying to eliminate should never worsen during these exercises. If this pain does worsen, stop and make certain you are following the directions exactly. If the pain is still present after adjustments, discontinue the  exercise until you can discuss the trouble with your caregiver. STRENGTHENING - Deep Abdominals, Pelvic Tilt   Lie on a firm bed or floor. Keeping your legs in front of you, bend your knees so they are both pointed toward the ceiling and your feet are flat on the floor.  Tense your lower abdominal muscles to press your low back into the floor. This motion will rotate your pelvis so that your tail bone is scooping upwards rather than pointing at your feet or into the floor. With a gentle tension and even breathing, hold this position for __________ seconds. Repeat __________ times. Complete this exercise __________ times per day.  STRENGTHENING - Abdominals, Crunches   Lie on a firm bed or floor. Keeping your legs in front of you, bend your knees so they are both pointed toward the ceiling and your feet are flat on the floor. Cross your arms over your chest.  Slightly tip your chin down without bending your neck.  Tense your abdominals and slowly lift your trunk high enough to just clear your shoulder blades. Lifting higher can put excessive stress on the lower back and does not further strengthen your abdominal muscles.  Control your return to the starting position. Repeat __________ times. Complete this exercise __________ times per day.  STRENGTHENING - Quadruped, Opposite UE/LE Lift   Assume a hands and knees position on a firm surface. Keep your hands under your shoulders and your knees under your hips. You may place padding under your knees for comfort.  Find your neutral spine and gently tense your abdominal muscles so that you can maintain this position. Your shoulders and hips should form a rectangle that is parallel with the floor and is not twisted.  Keeping your trunk steady, lift your right hand no higher than your shoulder and then your left leg no higher than your hip. Make sure you are not holding your breath. Hold this position for __________ seconds.  Continuing to keep  your abdominal muscles tense and your back steady, slowly return to your starting position. Repeat with the opposite arm and leg. Repeat __________ times. Complete this exercise __________ times per day.  STRENGTHENING - Abdominals and Quadriceps, Straight Leg Raise   Lie on a firm bed or floor with both legs extended in front of you.  Keeping one leg in contact with the floor, bend the other knee so that your foot can rest flat on the floor.  Find your neutral spine, and tense your abdominal muscles to maintain your spinal position throughout the exercise.  Slowly lift your straight leg off the floor about 6 inches for a count of   15, making sure to not hold your breath.  Still keeping your neutral spine, slowly lower your leg all the way to the floor. Repeat this exercise with each leg __________ times. Complete this exercise __________ times per day. POSTURE AND BODY MECHANICS CONSIDERATIONS - Low Back Sprain Keeping correct posture when sitting, standing or completing your activities will reduce the stress put on different body tissues, allowing injured tissues a chance to heal and limiting painful experiences. The following are general guidelines for improved posture. Your physician or physical therapist will provide you with any instructions specific to your needs. While reading these guidelines, remember:  The exercises prescribed by your provider will help you have the flexibility and strength to maintain correct postures.  The correct posture provides the best environment for your joints to work. All of your joints have less wear and tear when properly supported by a spine with good posture. This means you will experience a healthier, less painful body.  Correct posture must be practiced with all of your activities, especially prolonged sitting and standing. Correct posture is as important when doing repetitive low-stress activities (typing) as it is when doing a single heavy-load  activity (lifting). RESTING POSITIONS Consider which positions are most painful for you when choosing a resting position. If you have pain with flexion-based activities (sitting, bending, stooping, squatting), choose a position that allows you to rest in a less flexed posture. You would want to avoid curling into a fetal position on your side. If your pain worsens with extension-based activities (prolonged standing, working overhead), avoid resting in an extended position such as sleeping on your stomach. Most people will find more comfort when they rest with their spine in a more neutral position, neither too rounded nor too arched. Lying on a non-sagging bed on your side with a pillow between your knees, or on your back with a pillow under your knees will often provide some relief. Keep in mind, being in any one position for a prolonged period of time, no matter how correct your posture, can still lead to stiffness. PROPER SITTING POSTURE In order to minimize stress and discomfort on your spine, you must sit with correct posture. Sitting with good posture should be effortless for a healthy body. Returning to good posture is a gradual process. Many people can work toward this most comfortably by using various supports until they have the flexibility and strength to maintain this posture on their own. When sitting with proper posture, your ears will fall over your shoulders and your shoulders will fall over your hips. You should use the back of the chair to support your upper back. Your lower back will be in a neutral position, just slightly arched. You may place a small pillow or folded towel at the base of your lower back for  support.  When working at a desk, create an environment that supports good, upright posture. Without extra support, muscles tire, which leads to excessive strain on joints and other tissues. Keep these recommendations in mind: CHAIR:  A chair should be able to slide under your desk  when your back makes contact with the back of the chair. This allows you to work closely.  The chair's height should allow your eyes to be level with the upper part of your monitor and your hands to be slightly lower than your elbows. BODY POSITION  Your feet should make contact with the floor. If this is not possible, use a foot rest.  Keep your ears   over your shoulders. This will reduce stress on your neck and low back. INCORRECT SITTING POSTURES  If you are feeling tired and unable to assume a healthy sitting posture, do not slouch or slump. This puts excessive strain on your back tissues, causing more damage and pain. Healthier options include:  Using more support, like a lumbar pillow.  Switching tasks to something that requires you to be upright or walking.  Talking a brief walk.  Lying down to rest in a neutral-spine position. PROLONGED STANDING WHILE SLIGHTLY LEANING FORWARD  When completing a task that requires you to lean forward while standing in one place for a long time, place either foot up on a stationary 2-4 inch high object to help maintain the best posture. When both feet are on the ground, the lower back tends to lose its slight inward curve. If this curve flattens (or becomes too large), then the back and your other joints will experience too much stress, tire more quickly, and can cause pain. CORRECT STANDING POSTURES Proper standing posture should be assumed with all daily activities, even if they only take a few moments, like when brushing your teeth. As in sitting, your ears should fall over your shoulders and your shoulders should fall over your hips. You should keep a slight tension in your abdominal muscles to brace your spine. Your tailbone should point down to the ground, not behind your body, resulting in an over-extended swayback posture.  INCORRECT STANDING POSTURES  Common incorrect standing postures include a forward head, locked knees and/or an excessive  swayback. WALKING Walk with an upright posture. Your ears, shoulders and hips should all line-up. PROLONGED ACTIVITY IN A FLEXED POSITION When completing a task that requires you to bend forward at your waist or lean over a low surface, try to find a way to stabilize 3 out of 4 of your limbs. You can place a hand or elbow on your thigh or rest a knee on the surface you are reaching across. This will provide you more stability, so that your muscles do not tire as quickly. By keeping your knees relaxed, or slightly bent, you will also reduce stress across your lower back. CORRECT LIFTING TECHNIQUES DO :  Assume a wide stance. This will provide you more stability and the opportunity to get as close as possible to the object which you are lifting.  Tense your abdominals to brace your spine. Bend at the knees and hips. Keeping your back locked in a neutral-spine position, lift using your leg muscles. Lift with your legs, keeping your back straight.  Test the weight of unknown objects before attempting to lift them.  Try to keep your elbows locked down at your sides in order get the best strength from your shoulders when carrying an object.  Always ask for help when lifting heavy or awkward objects. INCORRECT LIFTING TECHNIQUES DO NOT:   Lock your knees when lifting, even if it is a small object.  Bend and twist. Pivot at your feet or move your feet when needing to change directions.  Assume that you can safely pick up even a paperclip without proper posture.   This information is not intended to replace advice given to you by your health care provider. Make sure you discuss any questions you have with your health care provider.   Document Released: 06/19/2005 Document Revised: 07/10/2014 Document Reviewed: 10/01/2008 [employer] Interactive Patient Education 2016 [employer] Viral Gastroenteritis Viral gastroenteritis is also known as stomach flu. This condition  affects the stomach and  intestinal tract. It can cause sudden diarrhea and vomiting. The illness typically lasts 3 to 8 days. Most people develop an immune response that eventually gets rid of the virus. While this natural response develops, the virus can make you quite ill. CAUSES  Many different viruses can cause gastroenteritis, such as rotavirus or noroviruses. You can catch one of these viruses by consuming contaminated food or water. You may also catch a virus by sharing utensils or other personal items with an infected person or by touching a contaminated surface. SYMPTOMS  The most common symptoms are diarrhea and vomiting. These problems can cause a severe loss of body fluids (dehydration) and a body salt (electrolyte) imbalance. Other symptoms may include:  Fever.  Headache.  Fatigue.  Abdominal pain. DIAGNOSIS  Your caregiver can usually diagnose viral gastroenteritis based on your symptoms and a physical exam. A stool sample may also be taken to test for the presence of viruses or other infections. TREATMENT  This illness typically goes away on its own. Treatments are aimed at rehydration. The most serious cases of viral gastroenteritis involve vomiting so severely that you are not able to keep fluids down. In these cases, fluids must be given through an intravenous line (IV). HOME CARE INSTRUCTIONS   Drink enough fluids to keep your urine clear or pale yellow. Drink small amounts of fluids frequently and increase the amounts as tolerated.  Ask your caregiver for specific rehydration instructions.  Avoid:  Foods high in sugar.  Alcohol.  Carbonated drinks.  Tobacco.  Juice.  Caffeine drinks.  Extremely hot or cold fluids.  Fatty, greasy foods.  Too much intake of anything at one time.  Dairy products until 24 to 48 hours after diarrhea stops.  You may consume probiotics. Probiotics are active cultures of beneficial bacteria. They may lessen the amount and number of diarrheal stools  in adults. Probiotics can be found in yogurt with active cultures and in supplements.  Wash your hands well to avoid spreading the virus.  Only take over-the-counter or prescription medicines for pain, discomfort, or fever as directed by your caregiver. Do not give aspirin to children. Antidiarrheal medicines are not recommended.  Ask your caregiver if you should continue to take your regular prescribed and over-the-counter medicines.  Keep all follow-up appointments as directed by your caregiver. SEEK IMMEDIATE MEDICAL CARE IF:   You are unable to keep fluids down.  You do not urinate at least once every 6 to 8 hours.  You develop shortness of breath.  You notice blood in your stool or vomit. This may look like coffee grounds.  You have abdominal pain that increases or is concentrated in one small area (localized).  You have persistent vomiting or diarrhea.  You have a fever.  The patient is a child younger than 3 months, and he or she has a fever.  The patient is a child older than 3 months, and he or she has a fever and persistent symptoms.  The patient is a child older than 3 months, and he or she has a fever and symptoms suddenly get worse.  The patient is a baby, and he or she has no tears when crying. MAKE SURE YOU:   Understand these instructions.  Will watch your condition.  Will get help right away if you are not doing well or get worse.   This information is not intended to replace advice given to you by your health care provider. Make sure  you discuss any questions you have with your health care provider.   Document Released: 06/19/2005 Document Revised: 09/11/2011 Document Reviewed: 04/05/2011 Elsevier Interactive Patient Education [employer]

## 2017-06-16 ENCOUNTER — Emergency Department (HOSPITAL_COMMUNITY): Payer: Self-pay

## 2017-06-16 ENCOUNTER — Emergency Department (HOSPITAL_COMMUNITY)
Admission: EM | Admit: 2017-06-16 | Discharge: 2017-06-16 | Disposition: A | Discharge status: Home / Self Care | Payer: Self-pay | Attending: Emergency Medicine | Admitting: Emergency Medicine

## 2017-06-16 ENCOUNTER — Encounter (HOSPITAL_COMMUNITY): Payer: Self-pay | Admitting: Emergency Medicine

## 2017-06-16 ENCOUNTER — Other Ambulatory Visit: Payer: Self-pay

## 2017-06-16 DIAGNOSIS — J45909 Unspecified asthma, uncomplicated: Secondary | ICD-10-CM | POA: Insufficient documentation

## 2017-06-16 DIAGNOSIS — M25561 Pain in right knee: Secondary | ICD-10-CM | POA: Insufficient documentation

## 2017-06-16 DIAGNOSIS — Y999 Unspecified external cause status: Secondary | ICD-10-CM | POA: Insufficient documentation

## 2017-06-16 DIAGNOSIS — Y92410 Unspecified street and highway as the place of occurrence of the external cause: Secondary | ICD-10-CM | POA: Insufficient documentation

## 2017-06-16 DIAGNOSIS — M545 Low back pain, unspecified: Secondary | ICD-10-CM

## 2017-06-16 DIAGNOSIS — Y939 Activity, unspecified: Secondary | ICD-10-CM | POA: Insufficient documentation

## 2017-06-16 DIAGNOSIS — F17210 Nicotine dependence, cigarettes, uncomplicated: Secondary | ICD-10-CM | POA: Insufficient documentation

## 2017-06-16 HISTORY — DX: Unspecified asthma, uncomplicated: J45.909

## 2017-06-16 MED ORDER — IBUPROFEN 800 MG PO TABS
800.0000 mg | ORAL_TABLET | Freq: Once | ORAL | Status: AC
  Administered 2017-06-16: 800 mg via ORAL
  Filled 2017-06-16: qty 1

## 2017-06-16 MED ORDER — CYCLOBENZAPRINE HCL 10 MG PO TABS: 10.0000 mg | ORAL_TABLET | Freq: Two times a day (BID) | ORAL | 0 refills | Status: DC | PRN

## 2017-06-16 NOTE — Discharge Instructions (Signed)
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
## 2017-12-08 ENCOUNTER — Emergency Department (HOSPITAL_COMMUNITY)
Admission: EM | Admit: 2017-12-08 | Discharge: 2017-12-08 | Disposition: A | Discharge status: Home / Self Care | Payer: Self-pay | Attending: Emergency Medicine | Admitting: Emergency Medicine

## 2017-12-08 ENCOUNTER — Encounter (HOSPITAL_COMMUNITY): Payer: Self-pay | Admitting: Emergency Medicine

## 2017-12-08 DIAGNOSIS — J45909 Unspecified asthma, uncomplicated: Secondary | ICD-10-CM | POA: Insufficient documentation

## 2017-12-08 DIAGNOSIS — F17210 Nicotine dependence, cigarettes, uncomplicated: Secondary | ICD-10-CM | POA: Insufficient documentation

## 2017-12-08 DIAGNOSIS — G510 Bell's palsy: Secondary | ICD-10-CM | POA: Insufficient documentation

## 2017-12-08 DIAGNOSIS — Z79899 Other long term (current) drug therapy: Secondary | ICD-10-CM | POA: Insufficient documentation

## 2017-12-08 MED ORDER — ACYCLOVIR 400 MG PO TABS: 400.0000 mg | ORAL_TABLET | Freq: Three times a day (TID) | ORAL | 0 refills | Status: AC

## 2017-12-08 NOTE — ED Provider Notes (Signed)
[facility]-EMERGENCY DEPT Provider Note   CSN: [id number] Arrival date & time: 12/08/17  1044     History   Chief Complaint Chief Complaint  Patient presents with  . Facial Droop    HPI Johnny Cooke is a 42 y.o. male.  HPI 42 year old male presents the emergency department with right sided facial and forehead weakness over the past 3 to 4 days.  Abnormal taste.  Feels like his eyes not blinking completely.  Denies weakness of his arms or legs.  No headache.  No other complaints.  Symptoms are mild to moderate and not improving   Past Medical History:  Diagnosis Date  . Asthma   . Trichimoniasis     There are no active problems to display for this patient.   History reviewed. No pertinent surgical history.      Home Medications    Prior to Admission medications   Medication Sig Start Date End Date Taking? Authorizing Provider  acyclovir (ZOVIRAX) 400 MG tablet Take 1 tablet (400 mg total) by mouth 3 (three) times daily for 7 days. 12/08/17 12/15/17  Azalia Bilisampos, Tashanna Dolin, MD  cyclobenzaprine (FLEXERIL) 10 MG tablet Take 1 tablet (10 mg total) by mouth 2 (two) times daily as needed for muscle spasms. 06/16/17   Jeanie SewerFawze, Mina A, PA-C    Family History Family History  Problem Relation Age of Onset  . Cancer Father     Social History Social History   Tobacco Use  . Smoking status: Current Every Day Smoker    Types: Cigars  . Smokeless tobacco: Never Used  Substance Use Topics  . Alcohol use: Yes    Alcohol/week: 0.0 oz    Comment: occ  . Drug use: No     Allergies   Patient has no known allergies.   Review of Systems Review of Systems  All other systems reviewed and are negative.    Physical Exam Updated Vital Signs BP 127/88 (BP Location: Left Arm)   Pulse 77   Temp 98.6 F (37 C) (Oral)   Resp 18   Ht 5\' 9"  (1.753 m)   SpO2 99%   BMI 23.63 kg/m   Physical Exam  Constitutional: He is oriented to person, place, and time. He  appears well-developed and well-nourished.  HENT:  Head: Normocephalic.  Eyes: EOM are normal.  Neck: Normal range of motion.  Cardiovascular: Normal rate.  Pulmonary/Chest: Effort normal.  Abdominal: He exhibits no distension.  Musculoskeletal: Normal range of motion.  Neurological: He is alert and oriented to person, place, and time.  Right-sided facial weakness and weakness of the right forehead  Psychiatric: He has a normal mood and affect.  Nursing note and vitals reviewed.    ED Treatments / Results  Labs (all labs ordered are listed, but only abnormal results are displayed) Labs Reviewed - No data to display  EKG None  Radiology No results found.  Procedures Procedures (including critical care time)  Medications Ordered in ED Medications - No data to display   Initial Impression / Assessment and Plan / ED Course  I have reviewed the triage vital signs and the nursing notes.  Pertinent labs & imaging results that were available during my care of the patient were reviewed by me and considered in my medical decision making (see chart for details).     Lamination consistent with Bell's palsy.  Home on antiviral medications.  Recommended artificial tears to the right eye.  Bell's palsy information sheet given  Final  Clinical Impressions(s) / ED Diagnoses   Final diagnoses:  Bell's palsy    ED Discharge Orders        Ordered    acyclovir (ZOVIRAX) 400 MG tablet  3 times daily     12/08/17 1103       Azalia Bilis, MD 12/08/17 1106

## 2017-12-08 NOTE — ED Triage Notes (Signed)
Pt c/o right sided facial drooping of mouth and right eye wont close as good as left x 4days. No other neuro deficits in triage.

## 2017-12-08 NOTE — Discharge Instructions (Signed)
Apply artificial tears to the right eye every hour while awake until symptoms resolve

## 2017-12-08 NOTE — ED Notes (Signed)
Bed: [facility] Expected date:  Expected time:  Means of arrival:  Comments: 

## 2019-01-13 ENCOUNTER — Other Ambulatory Visit: Payer: Self-pay

## 2019-01-15 ENCOUNTER — Encounter: Payer: Self-pay | Admitting: Medical

## 2019-01-15 ENCOUNTER — Other Ambulatory Visit: Payer: Self-pay

## 2019-01-15 ENCOUNTER — Ambulatory Visit (HOSPITAL_BASED_OUTPATIENT_CLINIC_OR_DEPARTMENT_OTHER)
Admission: RE | Admit: 2019-01-15 | Discharge: 2019-01-15 | Disposition: A | Discharge status: Home / Self Care | Payer: BC Managed Care – PPO | Source: Ambulatory Visit | Attending: Medical | Admitting: Medical

## 2019-01-15 ENCOUNTER — Ambulatory Visit: Payer: BC Managed Care – PPO | Admitting: Medical

## 2019-01-15 VITALS — BP 127/77 | HR 64 | Resp 16 | Ht 69.0 in | Wt 153.4 lb

## 2019-01-15 DIAGNOSIS — M5441 Lumbago with sciatica, right side: Secondary | ICD-10-CM

## 2019-01-15 DIAGNOSIS — M5442 Lumbago with sciatica, left side: Secondary | ICD-10-CM | POA: Diagnosis present

## 2019-01-15 DIAGNOSIS — G8929 Other chronic pain: Secondary | ICD-10-CM | POA: Diagnosis present

## 2019-01-15 MED ORDER — CYCLOBENZAPRINE HCL 10 MG PO TABS: ORAL_TABLET | ORAL | 0 refills | Status: DC

## 2019-01-15 MED ORDER — DICLOFENAC SODIUM 75 MG PO TBEC: 75.0000 mg | DELAYED_RELEASE_TABLET | Freq: Two times a day (BID) | ORAL | 0 refills | Status: DC
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
## 2019-01-15 NOTE — Patient Instructions (Addendum)
Nice to meet you today.   For your chronic intermittent back pain for years with recent flare, I do think is a good idea to repeat your lumbar spine x-ray to evaluate joint spaces and regions where the nerves exit the spine.  I will prescribe diclofenac for pain inflammation as well as making Flexeril available to use if needed.  Recommend back stretching exercises as tolerated on days that you have this pain.  If you have any worsening or changing signs symptoms such as leg weakness, foot drop or incontinence issues notify us.  Any acute  severe changes then recommend ED evaluation.  Recommend getting scheduled for CPE in the next 2 to 3 weeks.  Recommend early appointment in a.m. and come in fasting for 8 hours.   If back pain becomes more frequent/increases then could consider referral to sports medicine.   Back Exercises These exercises help to make your trunk and back strong. They also help to keep the lower back flexible. Doing these exercises can help to prevent back pain or lessen existing pain.  If you have back pain, try to do these exercises 2-3 times each day or as told by your doctor.  As you get better, do the exercises once each day. Repeat the exercises more often as told by your doctor.  To stop back pain from coming back, do the exercises once each day, or as told by your doctor. Exercises Single knee to chest Do these steps 3-5 times in a row for each leg: 1. Lie on your back on a firm bed or the floor with your legs stretched out. 2. Bring one knee to your chest. 3. Grab your knee or thigh with both hands and hold them it in place. 4. Pull on your knee until you feel a gentle stretch in your lower back or buttocks. 5. Keep doing the stretch for 10-30 seconds. 6. Slowly let go of your leg and straighten it. Pelvic tilt Do these steps 5-10 times in a row: 1. Lie on your back on a firm bed or the floor with your legs stretched out. 2. Bend your knees so they point up  to the ceiling. Your feet should be flat on the floor. 3. Tighten your lower belly (abdomen) muscles to press your lower back against the floor. This will make your tailbone point up to the ceiling instead of pointing down to your feet or the floor. 4. Stay in this position for 5-10 seconds while you gently tighten your muscles and breathe evenly. Cat-cow Do these steps until your lower back bends more easily: 1. Get on your hands and knees on a firm surface. Keep your hands under your shoulders, and keep your knees under your hips. You may put padding under your knees. 2. Let your head hang down toward your chest. Tighten (contract) the muscles in your belly. Point your tailbone toward the floor so your lower back becomes rounded like the back of a cat. 3. Stay in this position for 5 seconds. 4. Slowly lift your head. Let the muscles of your belly relax. Point your tailbone up toward the ceiling so your back forms a sagging arch like the back of a cow. 5. Stay in this position for 5 seconds.  Press-ups Do these steps 5-10 times in a row: 1. Lie on your belly (face-down) on the floor. 2. Place your hands near your head, about shoulder-width apart. 3. While you keep your back relaxed and keep your hips on the  floor, slowly straighten your arms to raise the top half of your body and lift your shoulders. Do not use your back muscles. You may change where you place your hands in order to make yourself more comfortable. 4. Stay in this position for 5 seconds. 5. Slowly return to lying flat on the floor.  Bridges Do these steps 10 times in a row: 1. Lie on your back on a firm surface. 2. Bend your knees so they point up to the ceiling. Your feet should be flat on the floor. Your arms should be flat at your sides, next to your body. 3. Tighten your butt muscles and lift your butt off the floor until your waist is almost as high as your knees. If you do not feel the muscles working in your butt and  the back of your thighs, slide your feet 1-2 inches farther away from your butt. 4. Stay in this position for 3-5 seconds. 5. Slowly lower your butt to the floor, and let your butt muscles relax. If this exercise is too easy, try doing it with your arms crossed over your chest. Belly crunches Do these steps 5-10 times in a row: 1. Lie on your back on a firm bed or the floor with your legs stretched out. 2. Bend your knees so they point up to the ceiling. Your feet should be flat on the floor. 3. Cross your arms over your chest. 4. Tip your chin a little bit toward your chest but do not bend your neck. 5. Tighten your belly muscles and slowly raise your chest just enough to lift your shoulder blades a tiny bit off of the floor. Avoid raising your body higher than that, because it can put too much stress on your low back. 6. Slowly lower your chest and your head to the floor. Back lifts Do these steps 5-10 times in a row: 1. Lie on your belly (face-down) with your arms at your sides, and rest your forehead on the floor. 2. Tighten the muscles in your legs and your butt. 3. Slowly lift your chest off of the floor while you keep your hips on the floor. Keep the back of your head in line with the curve in your back. Look at the floor while you do this. 4. Stay in this position for 3-5 seconds. 5. Slowly lower your chest and your face to the floor. Contact a doctor if:  Your back pain gets a lot worse when you do an exercise.  Your back pain does not get better 2 hours after you exercise. If you have any of these problems, stop doing the exercises. Do not do them again unless your doctor says it is okay. Get help right away if:  You have sudden, very bad back pain. If this happens, stop doing the exercises. Do not do them again unless your doctor says it is okay. This information is not intended to replace advice given to you by your health care provider. Make sure you discuss any questions you  have with your health care provider. Document Released: 07/22/2010 Document Revised: 03/14/2018 Document Reviewed: 03/14/2018 Elsevier Patient Education  2020 [employer]

## 2019-01-29 ENCOUNTER — Encounter: Payer: BC Managed Care – PPO | Admitting: Medical

## 2019-01-29 DIAGNOSIS — Z0289 Encounter for other administrative examinations: Secondary | ICD-10-CM

## 2019-06-16 ENCOUNTER — Ambulatory Visit
Admission: EM | Admit: 2019-06-16 | Discharge: 2019-06-16 | Disposition: A | Discharge status: Home / Self Care | Payer: BC Managed Care – PPO

## 2019-06-16 ENCOUNTER — Encounter: Payer: Self-pay | Admitting: Emergency Medicine

## 2019-06-16 ENCOUNTER — Other Ambulatory Visit: Payer: Self-pay

## 2019-06-16 DIAGNOSIS — R1084 Generalized abdominal pain: Secondary | ICD-10-CM

## 2019-06-16 NOTE — ED Provider Notes (Signed)
[facility]    CSN: [id number] Arrival date & time: 06/16/19  1004      History   Chief Complaint Chief Complaint  Patient presents with  . Abdominal Pain    HPI Konstantin Lehnen is a 43 y.o. male presenting for generalized abdominal pain since this morning.  States he ate cheesecake factory 3 days ago, left over 2 days ago.  Developed some lower abdominal pain shortly thereafter which then transition to lower abdominal pain.  Patient denies change in urination, bowel movements.  No blood in stool, rectal pain, fever, nausea/vomiting.  Patient denies penile or testicular pain, swelling, discharge.  Patient states that he left work yesterday, stayed home today despite "feeling better than before ".  Requesting work note.  No known sick contacts.  Last BM was yesterday and normal per patient.   Past Medical History:  Diagnosis Date  . Asthma   . Trichimoniasis     There are no problems to display for this patient.   History reviewed. No pertinent surgical history.     Home Medications    Prior to Admission medications   Not on File    Family History Family History  Problem Relation Age of Onset  . Cancer Father     Social History Social History   Tobacco Use  . Smoking status: Current Every Day Smoker    Packs/day: 2.00    Types: Cigars  . Smokeless tobacco: Never Used  Substance Use Topics  . Alcohol use: Yes    Alcohol/week: 0.0 standard drinks    Comment: occ.   . Drug use: No     Allergies   Patient has no known allergies.   Review of Systems Review of Systems  Constitutional: Negative for fatigue and fever.  Respiratory: Negative for cough and shortness of breath.   Cardiovascular: Negative for chest pain and palpitations.  Gastrointestinal: Positive for abdominal pain. Negative for abdominal distention, anal bleeding, blood in stool, constipation, diarrhea, nausea, rectal pain and vomiting.  Musculoskeletal: Negative for  arthralgias and myalgias.  Skin: Negative for rash and wound.  Neurological: Negative for speech difficulty and headaches.  All other systems reviewed and are negative.    Physical Exam Triage Vital Signs ED Triage Vitals  Enc Vitals Group     BP      Pulse      Resp      Temp      Temp src      SpO2      Weight      Height      Head Circumference      Peak Flow      Pain Score      Pain Loc      Pain Edu?      Excl. in GC?    No data found.  Updated Vital Signs BP 127/73 (BP Location: Left Arm)   Pulse 78   Temp 98.7 F (37.1 C) (Oral)   Resp 16   SpO2 98%   Visual Acuity Right Eye Distance:   Left Eye Distance:   Bilateral Distance:    Right Eye Near:   Left Eye Near:    Bilateral Near:     Physical Exam Constitutional:      General: He is not in acute distress.    Appearance: He is well-developed and normal weight. He is not ill-appearing.  HENT:     Head: Normocephalic and atraumatic.  Eyes:     General:  No scleral icterus.    Pupils: Pupils are equal, round, and reactive to light.  Cardiovascular:     Rate and Rhythm: Normal rate and regular rhythm.  Pulmonary:     Effort: Pulmonary effort is normal. No respiratory distress.     Breath sounds: No wheezing.  Abdominal:     General: Bowel sounds are normal.     Palpations: Abdomen is soft. There is no hepatomegaly, splenomegaly or pulsatile mass.     Tenderness: There is generalized abdominal tenderness. There is no right CVA tenderness, left CVA tenderness, guarding or rebound. Negative signs include Murphy's sign, Rovsing's sign and McBurney's sign.     Hernia: No hernia is present.  Genitourinary:    Comments: deferred Skin:    Capillary Refill: Capillary refill takes less than 2 seconds.     Coloration: Skin is not cyanotic, jaundiced, mottled or pale.  Neurological:     Mental Status: He is alert and oriented to person, place, and time.      UC Treatments / Results  Labs (all labs  ordered are listed, but only abnormal results are displayed) Labs Reviewed - No data to display  EKG   Radiology No results found.  Procedures Procedures (including critical care time)  Medications Ordered in UC Medications - No data to display  Initial Impression / Assessment and Plan / UC Course  I have reviewed the triage vital signs and the nursing notes.  Pertinent labs & imaging results that were available during my care of the patient were reviewed by me and considered in my medical decision making (see chart for details).     Patient afebrile, nontoxic.  Patient reporting generalized abdominal pain after eating leftovers a few days ago.  No signs of acute abdomen.  Patient to continue supportively as outlined below.  Work note provided.  Additional testing deferred at this time.  Return precautions discussed, patient verbalized understanding and is agreeable to plan. Final Clinical Impressions(s) / UC Diagnoses   Final diagnoses:  Generalized abdominal pain     Discharge Instructions     Try bland diet as outlined in discharge paperwork. May try heating pads for additional pain relief. Important to monitor symptoms and follow-up with PCP if needed. Go to ER for worsening abdominal pain, blood in stool, rectal pain, fever, vomiting, weakness.    ED Prescriptions    None     PDMP not reviewed this encounter.   Hall-Potvin, [place], [place] 06/16/19 1038

## 2019-06-16 NOTE — ED Triage Notes (Addendum)
Pt presents to [facility] for assessment after a generalized abdominal pain that started after eating breakfast this morning.  Pt denies nausea, but states he did attempt to make himself throw up yesterday without relief of pain.  Denies diarrheal, LBM yesterday.  Pt coughing in room, but states it is a smokers cough.  Before this RN left room post triage, patient requested a work note for today having him go back tomorrow.

## 2019-06-16 NOTE — Discharge Instructions (Signed)
Try bland diet as outlined in discharge paperwork. May try heating pads for additional pain relief. Important to monitor symptoms and follow-up with PCP if needed. Go to ER for worsening abdominal pain, blood in stool, rectal pain, fever, vomiting, weakness.

## 2019-11-03 ENCOUNTER — Other Ambulatory Visit: Payer: Self-pay

## 2019-11-03 ENCOUNTER — Ambulatory Visit (HOSPITAL_COMMUNITY)
Admission: EM | Admit: 2019-11-03 | Discharge: 2019-11-03 | Disposition: A | Discharge status: Home / Self Care | Payer: BC Managed Care – PPO | Attending: Urgent Care | Admitting: Urgent Care

## 2019-11-03 DIAGNOSIS — A599 Trichomoniasis, unspecified: Secondary | ICD-10-CM | POA: Diagnosis not present

## 2019-11-03 DIAGNOSIS — Z202 Contact with and (suspected) exposure to infections with a predominantly sexual mode of transmission: Secondary | ICD-10-CM

## 2019-11-03 MED ORDER — METRONIDAZOLE 500 MG PO TABS: 500.0000 mg | ORAL_TABLET | Freq: Two times a day (BID) | ORAL | 0 refills | Status: DC

## 2019-11-03 NOTE — ED Triage Notes (Signed)
Sex partner tested positive for STD today

## 2019-11-03 NOTE — ED Provider Notes (Signed)
  [facility]   MRN: [id number] DOB: [birth year]  Subjective:   Johnny Cooke is a 44 y.o. male presenting for tx for trichomonas. His girlfriend tested positive for this, results received today. Denies dysuria, hematuria, urinary frequency, penile discharge, penile swelling, testicular pain, testicular swelling, anal pain, groin pain.   No current facility-administered medications for this encounter. No current outpatient medications on file.   No Known Allergies  Past Medical History:  Diagnosis Date  . Asthma   . Trichimoniasis      No past surgical history on file.  Family History  Problem Relation Age of Onset  . Cancer Father     Social History   Tobacco Use  . Smoking status: Current Every Day Smoker    Packs/day: 2.00    Types: Cigars  . Smokeless tobacco: Never Used  Substance Use Topics  . Alcohol use: Yes    Alcohol/week: 0.0 standard drinks    Comment: occ.   . Drug use: No    ROS   Objective:   Vitals: BP 120/70   Pulse 74   Temp 98 F (36.7 C)   Resp 16   SpO2 100%   Physical Exam Constitutional:      General: He is not in acute distress.    Appearance: Normal appearance. He is well-developed and normal weight. He is not ill-appearing, toxic-appearing or diaphoretic.  HENT:     Head: Normocephalic and atraumatic.     Right Ear: External ear normal.     Left Ear: External ear normal.     Nose: Nose normal.     Mouth/Throat:     Pharynx: Oropharynx is clear.  Eyes:     General: No scleral icterus.       Right eye: No discharge.        Left eye: No discharge.     Extraocular Movements: Extraocular movements intact.     Pupils: Pupils are equal, round, and reactive to light.  Cardiovascular:     Rate and Rhythm: Normal rate.  Pulmonary:     Effort: Pulmonary effort is normal.  Genitourinary:    Penis: Circumcised. No phimosis, paraphimosis, hypospadias, erythema, tenderness, discharge, swelling or lesions.     Musculoskeletal:     Cervical back: Normal range of motion.  Neurological:     Mental Status: He is alert and oriented to person, place, and time.  Psychiatric:        Mood and Affect: Mood normal.        Behavior: Behavior normal.        Thought Content: Thought content normal.        Judgment: Judgment normal.     Assessment and Plan :   PDMP not reviewed this encounter.  1. Trichomonal infection   2. STD exposure     Treated with Flagyl. Labs pending.   Counseled on safe sex practices including abstaining for 1 week following treatment.  Counseled patient on potential for adverse effects with medications prescribed/recommended today, ER and return-to-clinic precautions discussed, patient verbalized understanding.    Wallis Bamberg, [place] 11/03/19 2021

## 2019-11-05 LAB — CYTOLOGY, (ORAL, ANAL, URETHRAL) ANCILLARY ONLY
Chlamydia: NEGATIVE
Comment: NEGATIVE
Comment: NEGATIVE
Comment: NORMAL
Neisseria Gonorrhea: NEGATIVE
Trichomonas: NEGATIVE

## 2020-02-13 ENCOUNTER — Other Ambulatory Visit: Payer: Self-pay

## 2020-02-13 ENCOUNTER — Ambulatory Visit (HOSPITAL_COMMUNITY)
Admission: EM | Admit: 2020-02-13 | Discharge: 2020-02-13 | Disposition: A | Discharge status: Home / Self Care | Payer: BC Managed Care – PPO | Attending: Physician Assistant | Admitting: Physician Assistant

## 2020-02-13 ENCOUNTER — Encounter (HOSPITAL_COMMUNITY): Payer: Self-pay

## 2020-02-13 DIAGNOSIS — K047 Periapical abscess without sinus: Secondary | ICD-10-CM | POA: Insufficient documentation

## 2020-02-13 DIAGNOSIS — Z113 Encounter for screening for infections with a predominantly sexual mode of transmission: Secondary | ICD-10-CM | POA: Insufficient documentation

## 2020-02-13 MED ORDER — AMOXICILLIN-POT CLAVULANATE 875-125 MG PO TABS
1.0000 | ORAL_TABLET | Freq: Two times a day (BID) | ORAL | 0 refills | Status: AC
Start: 2020-02-13 — End: 2020-02-23

## 2020-02-13 MED ORDER — ACETAMINOPHEN 500 MG PO TABS
1000.0000 mg | ORAL_TABLET | Freq: Three times a day (TID) | ORAL | 0 refills | Status: DC | PRN
Start: 2020-02-13 — End: 2020-05-07

## 2020-02-13 MED ORDER — IBUPROFEN 800 MG PO TABS
800.0000 mg | ORAL_TABLET | Freq: Three times a day (TID) | ORAL | 0 refills | Status: DC
Start: 2020-02-13 — End: 2020-05-07

## 2020-02-13 NOTE — Discharge Instructions (Signed)
Take all the medicines as prescribed  If you develop significant worsening swelling, unable to open your mouth, high fevers, return or go directly to the emergency department.  We have sent your test we will call you to change any treatments.  Call the dentist today to schedule follow-up next week.

## 2020-02-13 NOTE — ED Provider Notes (Signed)
[facility]    CSN: [id number] Arrival date & time: 02/13/20  2505      History   Chief Complaint Chief Complaint  Patient presents with  . dental pain    HPI Johnny Cooke is a 44 y.o. male.   Patient presents for evaluation over the last few days he has had left lower dental pain with swelling developing over the last 24 to 48 hours.  He reports he has had issues with his teeth in the past.  He knows he has teeth here that need to be removed.  He has not had an infection in a while.  He denies difficulty swallowing.  No neck swelling.  No fevers or chills.   Also like to be screened for STDs.  Denies symptoms.     Past Medical History:  Diagnosis Date  . Asthma   . Trichimoniasis     There are no problems to display for this patient.   History reviewed. No pertinent surgical history.     Home Medications    Prior to Admission medications   Medication Sig Start Date End Date Taking? Authorizing Provider  acetaminophen (TYLENOL) 500 MG tablet Take 2 tablets (1,000 mg total) by mouth every 8 (eight) hours as needed. 02/13/20   Shiva Sahagian, Veryl Speak, PA-C  amoxicillin-clavulanate (AUGMENTIN) 875-125 MG tablet Take 1 tablet by mouth 2 (two) times daily for 10 days. 02/13/20 02/23/20  Earle Burson, Veryl Speak, PA-C  ibuprofen (ADVIL) 800 MG tablet Take 1 tablet (800 mg total) by mouth 3 (three) times daily. 02/13/20   Hadriel Northup, Veryl Speak, PA-C  metroNIDAZOLE (FLAGYL) 500 MG tablet Take 1 tablet (500 mg total) by mouth 2 (two) times daily with a meal. DO NOT CONSUME ALCOHOL WHILE TAKING THIS MEDICATION. 11/03/19   Wallis Bamberg, PA-C    Family History Family History  Problem Relation Age of Onset  . Cancer Father     Social History Social History   Tobacco Use  . Smoking status: Current Every Day Smoker    Packs/day: 2.00    Types: Cigars  . Smokeless tobacco: Never Used  Vaping Use  . Vaping Use: Never used  Substance Use Topics  . Alcohol use: Yes    Alcohol/week: 0.0  standard drinks    Comment: occ.   . Drug use: No     Allergies   Patient has no known allergies.   Review of Systems Review of Systems   Physical Exam Triage Vital Signs ED Triage Vitals  Enc Vitals Group     BP 02/13/20 0957 126/87     Pulse Rate 02/13/20 0957 74     Resp 02/13/20 0957 16     Temp 02/13/20 0957 98.2 F (36.8 C)     Temp Source 02/13/20 0957 Oral     SpO2 02/13/20 0957 100 %     Weight 02/13/20 0959 160 lb (72.6 kg)     Height 02/13/20 0959 5\' 9"  (1.753 m)     Head Circumference --      Peak Flow --      Pain Score 02/13/20 0959 7     Pain Loc --      Pain Edu? --      Excl. in GC? --    No data found.  Updated Vital Signs BP 126/87   Pulse 74   Temp 98.2 F (36.8 C) (Oral)   Resp 16   Ht 5\' 9"  (1.753 m)   Wt 160 lb (72.6 kg)  SpO2 100%   BMI 23.63 kg/m   Visual Acuity Right Eye Distance:   Left Eye Distance:   Bilateral Distance:    Right Eye Near:   Left Eye Near:    Bilateral Near:     Physical Exam Vitals and nursing note reviewed.  Constitutional:      Appearance: Normal appearance.  HENT:     Head: Normocephalic.      Comments: Following over the left mandible, no appreciable abscess or fluctuance.  Tenderness to palpation in the area of swelling, marked on diagram    Mouth/Throat:      Comments: Dentition overall poor multiple missing rear molars.  No sublingual swelling.  There is some buccal swelling on the left lower jaw adjacent to marked teeth on diagram.  Controlling secretions. No trismus Neck:     Comments: There is no submental or submandibular swelling or firmness Musculoskeletal:     Cervical back: Normal range of motion. No rigidity.  Lymphadenopathy:     Cervical: No cervical adenopathy.  Neurological:     Mental Status: He is alert.      UC Treatments / Results  Labs (all labs ordered are listed, but only abnormal results are displayed) Labs Reviewed  CYTOLOGY, (ORAL, ANAL, URETHRAL)  ANCILLARY ONLY    EKG   Radiology No results found.  Procedures Procedures (including critical care time)  Medications Ordered in UC Medications - No data to display  Initial Impression / Assessment and Plan / UC Course  I have reviewed the triage vital signs and the nursing notes.  Pertinent labs & imaging results that were available during my care of the patient were reviewed by me and considered in my medical decision making (see chart for details).     #Dental infection Patient is a 44 year old presenting with a left lower dental infection.  No airway threat or sign of deep space infection.  Will start on Augmentin.  Ibuprofen and Tylenol for pain relief.  Instructed patient to follow-up with dentist.  Resources given.  Return and emergency department precautions were discussed.  Patient verbalized understanding plan of care.  Screening for STDs-swab sent. Final Clinical Impressions(s) / UC Diagnoses   Final diagnoses:  Dental infection  Screen for STD (sexually transmitted disease)     Discharge Instructions     Take all the medicines as prescribed  If you develop significant worsening swelling, unable to open your mouth, high fevers, return or go directly to the emergency department.  We have sent your test we will call you to change any treatments.  Call the dentist today to schedule follow-up next week.      ED Prescriptions    Medication Sig Dispense Auth. Provider   amoxicillin-clavulanate (AUGMENTIN) 875-125 MG tablet Take 1 tablet by mouth 2 (two) times daily for 10 days. 20 tablet Dustyn Dansereau, Veryl Speak, PA-C   ibuprofen (ADVIL) 800 MG tablet Take 1 tablet (800 mg total) by mouth 3 (three) times daily. 21 tablet Aqua Denslow, Veryl Speak, PA-C   acetaminophen (TYLENOL) 500 MG tablet Take 2 tablets (1,000 mg total) by mouth every 8 (eight) hours as needed. 30 tablet Tomie Elko, Veryl Speak, PA-C     PDMP not reviewed this encounter.   Hermelinda Medicus, PA-C 02/13/20 1440

## 2020-02-13 NOTE — ED Triage Notes (Signed)
Pt c/o tooth abscess on left lower sidex2 days. Pt has 2+ swelling of left side of jaw. Pt denies chewing issues. Pt also wants STI testing. PT denies sx.

## 2020-02-16 LAB — CYTOLOGY, (ORAL, ANAL, URETHRAL) ANCILLARY ONLY
Chlamydia: NEGATIVE
Comment: NEGATIVE
Comment: NEGATIVE
Comment: NORMAL
Neisseria Gonorrhea: NEGATIVE
Trichomonas: POSITIVE — AB

## 2020-02-17 ENCOUNTER — Telehealth (HOSPITAL_COMMUNITY): Payer: Self-pay | Admitting: Emergency Medicine

## 2020-02-17 MED ORDER — METRONIDAZOLE 500 MG PO TABS
500.0000 mg | ORAL_TABLET | Freq: Two times a day (BID) | ORAL | 0 refills | Status: DC
Start: 2020-02-17 — End: 2020-03-25

## 2020-03-25 ENCOUNTER — Encounter (HOSPITAL_COMMUNITY): Payer: Self-pay

## 2020-03-25 ENCOUNTER — Other Ambulatory Visit: Payer: Self-pay

## 2020-03-25 ENCOUNTER — Ambulatory Visit (INDEPENDENT_AMBULATORY_CARE_PROVIDER_SITE_OTHER): Payer: BC Managed Care – PPO

## 2020-03-25 ENCOUNTER — Ambulatory Visit (HOSPITAL_COMMUNITY)
Admission: EM | Admit: 2020-03-25 | Discharge: 2020-03-25 | Disposition: A | Discharge status: Home / Self Care | Payer: BC Managed Care – PPO | Attending: Urgent Care | Admitting: Urgent Care

## 2020-03-25 DIAGNOSIS — R062 Wheezing: Secondary | ICD-10-CM | POA: Diagnosis not present

## 2020-03-25 DIAGNOSIS — J4520 Mild intermittent asthma, uncomplicated: Secondary | ICD-10-CM | POA: Diagnosis not present

## 2020-03-25 DIAGNOSIS — R079 Chest pain, unspecified: Secondary | ICD-10-CM | POA: Diagnosis not present

## 2020-03-25 DIAGNOSIS — R0602 Shortness of breath: Secondary | ICD-10-CM

## 2020-03-25 DIAGNOSIS — R0789 Other chest pain: Secondary | ICD-10-CM

## 2020-03-25 DIAGNOSIS — R059 Cough, unspecified: Secondary | ICD-10-CM

## 2020-03-25 DIAGNOSIS — R05 Cough: Secondary | ICD-10-CM

## 2020-03-25 DIAGNOSIS — J45909 Unspecified asthma, uncomplicated: Secondary | ICD-10-CM | POA: Diagnosis not present

## 2020-03-25 MED ORDER — ALBUTEROL SULFATE HFA 108 (90 BASE) MCG/ACT IN AERS
INHALATION_SPRAY | RESPIRATORY_TRACT | Status: AC
  Filled 2020-03-25: qty 6.7

## 2020-03-25 MED ORDER — METHYLPREDNISOLONE SODIUM SUCC 125 MG IJ SOLR
INTRAMUSCULAR | Status: AC
  Filled 2020-03-25: qty 2

## 2020-03-25 MED ORDER — ALBUTEROL SULFATE HFA 108 (90 BASE) MCG/ACT IN AERS
2.0000 | INHALATION_SPRAY | Freq: Once | RESPIRATORY_TRACT | Status: AC
  Administered 2020-03-25: 2 via RESPIRATORY_TRACT

## 2020-03-25 MED ORDER — METHYLPREDNISOLONE SODIUM SUCC 125 MG IJ SOLR
125.0000 mg | Freq: Once | INTRAMUSCULAR | Status: AC
  Administered 2020-03-25: 125 mg via INTRAMUSCULAR

## 2020-03-25 NOTE — ED Provider Notes (Signed)
[facility]   MRN: [id number] DOB: [birth year]  Subjective:   Markon Jares is a 44 y.o. male presenting for 2 to 4-week history of persistent chest tightness, coughing, shortness of breath.  Patient used to have a history of asthma, has not needed an inhaler for the past 10 to 20 years.  He recently got diagnosed with COVID-19 in July.  He recovered without any interventions.  His son has asthma, has been using his albuterol inhaler.  No current facility-administered medications for this encounter.  Current Outpatient Medications:    acetaminophen (TYLENOL) 500 MG tablet, Take 2 tablets (1,000 mg total) by mouth every 8 (eight) hours as needed., Disp: 30 tablet, Rfl: 0   ibuprofen (ADVIL) 800 MG tablet, Take 1 tablet (800 mg total) by mouth 3 (three) times daily., Disp: 21 tablet, Rfl: 0   No Known Allergies  Past Medical History:  Diagnosis Date   Asthma    Trichimoniasis      History reviewed. No pertinent surgical history.  Family History  Problem Relation Age of Onset   Cancer Father     Social History   Tobacco Use   Smoking status: Current Every Day Smoker    Packs/day: 2.00    Types: Cigars   Smokeless tobacco: Never Used  Vaping Use   Vaping Use: Never used  Substance Use Topics   Alcohol use: Yes    Alcohol/week: 0.0 standard drinks    Comment: occ.    Drug use: No    ROS   Objective:   Vitals: BP 124/86 (BP Location: Right Arm)    Pulse 76    Temp 98.9 F (37.2 C) (Oral)    Resp 17    SpO2 96%   Physical Exam Constitutional:      General: He is not in acute distress.    Appearance: Normal appearance. He is well-developed. He is not ill-appearing, toxic-appearing or diaphoretic.  HENT:     Head: Normocephalic and atraumatic.     Right Ear: External ear normal.     Left Ear: External ear normal.     Nose: Nose normal.     Mouth/Throat:     Mouth: Mucous membranes are moist.     Pharynx: Oropharynx is clear.  Eyes:      General: No scleral icterus.    Extraocular Movements: Extraocular movements intact.     Pupils: Pupils are equal, round, and reactive to light.  Cardiovascular:     Rate and Rhythm: Normal rate and regular rhythm.     Heart sounds: Normal heart sounds. No murmur heard.  No friction rub. No gallop.   Pulmonary:     Effort: Pulmonary effort is normal. No respiratory distress.     Breath sounds: Normal breath sounds. No stridor. No wheezing, rhonchi or rales.  Neurological:     Mental Status: He is alert and oriented to person, place, and time.  Psychiatric:        Mood and Affect: Mood normal.        Behavior: Behavior normal.        Thought Content: Thought content normal.     DG Chest 2 View  Result Date: 03/25/2020 CLINICAL DATA:  Sob, wheezing, and productive cough x 2 weeks. Intermittent chest pain x 2 days. No hx of high blood pressure or diabetes. Hx of asthma. Recreational smoker. Per provider:chest pain, cough EXAM: CHEST - 2 VIEW COMPARISON:  none FINDINGS: Lungs are clear. Heart size and  mediastinal contours are within normal limits. No effusion.  No pneumothorax. Visualized bones unremarkable. IMPRESSION: No acute cardiopulmonary disease. Electronically Signed   By: Corlis Leak M.D.   On: 03/25/2020 10:15   Assessment and Plan :   PDMP not reviewed this encounter.  1. Mild intermittent asthma without complication   2. Cough   3. Shortness of breath   4. Chest tightness     Physical exam findings reasonable for outpatient management, vital signs reassuring.  Patient received an albuterol inhaler in clinic, took 2 puffs and felt substantially improved in his chest.  He opted for IM Solu-Medrol as opposed to oral steroid course due to compliance.  Recommended follow-up with the asthma center if he continues to have problems with his asthma.  Emphasized need to consider quitting smoking cigars and marijuana. Counseled patient on potential for adverse effects with  medications prescribed/recommended today, ER and return-to-clinic precautions discussed, patient verbalized understanding.    Wallis Bamberg, PA-C 03/25/20 1043

## 2020-03-25 NOTE — ED Triage Notes (Signed)
Pt presents with shortness of breath for over a month after having covid in July: pt states he has childhood asthma and after having covid he has been having breathing issues.

## 2020-04-16 ENCOUNTER — Ambulatory Visit: Payer: BC Managed Care – PPO | Admitting: Physician Assistant

## 2020-05-07 ENCOUNTER — Encounter: Payer: Self-pay | Admitting: Physician Assistant

## 2020-05-07 ENCOUNTER — Other Ambulatory Visit: Payer: Self-pay

## 2020-05-07 ENCOUNTER — Ambulatory Visit: Payer: BC Managed Care – PPO | Admitting: Physician Assistant

## 2020-05-07 VITALS — BP 110/80 | HR 84 | Temp 98.4°F | Resp 16 | Ht 70.0 in | Wt 165.0 lb

## 2020-05-07 DIAGNOSIS — J4520 Mild intermittent asthma, uncomplicated: Secondary | ICD-10-CM | POA: Insufficient documentation

## 2020-05-07 DIAGNOSIS — F1290 Cannabis use, unspecified, uncomplicated: Secondary | ICD-10-CM

## 2020-05-07 DIAGNOSIS — R109 Unspecified abdominal pain: Secondary | ICD-10-CM

## 2020-05-07 NOTE — Patient Instructions (Addendum)
It was very nice meeting you today. Welcome to [facility]!  I recommend you keep a well-balanced diet that is rich in fiber. Start a daily probiotic. If symptoms are recurring I would want you to be seen by Gastroenterology.   Please schedule a physical at your earliest convenience.    Preventive Care 43-42 Years Old, Male Preventive care refers to lifestyle choices and visits with your health care provider that can promote health and wellness. This includes:  A yearly physical exam. This is also called an annual well check.  Regular dental and eye exams.  Immunizations.  Screening for certain conditions.  Healthy lifestyle choices, such as eating a healthy diet, getting regular exercise, not using drugs or products that contain nicotine and tobacco, and limiting alcohol use. What can I expect for my preventive care visit? Physical exam Your health care provider will check:  Height and weight. These may be used to calculate body mass index (BMI), which is a measurement that tells if you are at a healthy weight.  Heart rate and blood pressure.  Your skin for abnormal spots. Counseling Your health care provider may ask you questions about:  Alcohol, tobacco, and drug use.  Emotional well-being.  Home and relationship well-being.  Sexual activity.  Eating habits.  Work and work Statistician. What immunizations do I need?  Influenza (flu) vaccine  This is recommended every year. Tetanus, diphtheria, and pertussis (Tdap) vaccine  You may need a Td booster every 10 years. Varicella (chickenpox) vaccine  You may need this vaccine if you have not already been vaccinated. Zoster (shingles) vaccine  You may need this after age 9. Measles, mumps, and rubella (MMR) vaccine  You may need at least one dose of MMR if you were born in 1957 or later. You may also need a second dose. Pneumococcal conjugate (PCV13) vaccine  You may need this if you have certain conditions  and were not previously vaccinated. Pneumococcal polysaccharide (PPSV23) vaccine  You may need one or two doses if you smoke cigarettes or if you have certain conditions. Meningococcal conjugate (MenACWY) vaccine  You may need this if you have certain conditions. Hepatitis A vaccine  You may need this if you have certain conditions or if you travel or work in places where you may be exposed to hepatitis A. Hepatitis B vaccine  You may need this if you have certain conditions or if you travel or work in places where you may be exposed to hepatitis B. Haemophilus influenzae type b (Hib) vaccine  You may need this if you have certain risk factors. Human papillomavirus (HPV) vaccine  If recommended by your health care provider, you may need three doses over 6 months. You may receive vaccines as individual doses or as more than one vaccine together in one shot (combination vaccines). Talk with your health care provider about the risks and benefits of combination vaccines. What tests do I need? Blood tests  Lipid and cholesterol levels. These may be checked every 5 years, or more frequently if you are over 3 years old.  Hepatitis C test.  Hepatitis B test. Screening  Lung cancer screening. You may have this screening every year starting at age 62 if you have a 30-pack-year history of smoking and currently smoke or have quit within the past 15 years.  Prostate cancer screening. Recommendations will vary depending on your family history and other risks.  Colorectal cancer screening. All adults should have this screening starting at age 18 and continuing  until age 75. Your health care provider may recommend screening at age 44 if you are at increased risk. You will have tests every 1-10 years, depending on your results and the type of screening test.  Diabetes screening. This is done by checking your blood sugar (glucose) after you have not eaten for a while (fasting). You may have this  done every 1-3 years.  Sexually transmitted disease (STD) testing. Follow these instructions at home: Eating and drinking  Eat a diet that includes fresh fruits and vegetables, whole grains, lean protein, and low-fat dairy products.  Take vitamin and mineral supplements as recommended by your health care provider.  Do not drink alcohol if your health care provider tells you not to drink.  If you drink alcohol: ? Limit how much you have to 0-2 drinks a day. ? Be aware of how much alcohol is in your drink. In the [place] one drink equals one 12 oz bottle of beer (355 mL), one 5 oz glass of wine (148 mL), or one 1 oz glass of hard liquor (44 mL). Lifestyle  Take daily care of your teeth and gums.  Stay active. Exercise for at least 30 minutes on 5 or more days each week.  Do not use any products that contain nicotine or tobacco, such as cigarettes, e-cigarettes, and chewing tobacco. If you need help quitting, ask your health care provider.  If you are sexually active, practice safe sex. Use a condom or other form of protection to prevent STIs (sexually transmitted infections).  Talk with your health care provider about taking a low-dose aspirin every day starting at age 54. What's next?  Go to your health care provider once a year for a well check visit.  Ask your health care provider how often you should have your eyes and teeth checked.  Stay up to date on all vaccines. This information is not intended to replace advice given to you by your health care provider. Make sure you discuss any questions you have with your health care provider. Document Revised: 06/13/2018 Document Reviewed: 06/13/2018 Elsevier Patient Education  2020 [employer].

## 2020-05-07 NOTE — Progress Notes (Signed)
Patient presents to clinic today transferring care from [facility] office. Has not been seen in > 1 year. Has history of asthma, mainly in childhood which he overall outgrew. Since having COVID infection earlier this year has had some intermittent asthma issues. Denies nighttime symptoms. Denies exacerbation requiring oral steroids.  Not had to use his rescue inhaler in greater than 5 weeks.    Is s/p appendectomy and cholecystectomy. Notes intermittent episodes over the past several years with bilateral lower abdominal pain. Sometimes affected by what he is eating but not always. Denies unexplainable fevers or weight loss. Notes overall bowel habits are regular. Very sporadic, about 5 x in the past 7-8 years.  Past Medical History:  Diagnosis Date   Asthma    Trichimoniasis     Current Outpatient Medications on File Prior to Visit  Medication Sig Dispense Refill   albuterol (VENTOLIN HFA) 108 (90 Base) MCG/ACT inhaler Inhale 1-2 puffs into the lungs every 6 (six) hours as needed for wheezing or shortness of breath.     No current facility-administered medications on file prior to visit.    No Known Allergies  Family History  Problem Relation Age of Onset   Healthy Mother    Cancer Father        unknown type   Cancer Paternal Grandfather        unknown tupe    Social History   Socioeconomic History   Marital status: Single    Spouse name: Not on file   Number of children: Not on file   Years of education: Not on file   Highest education level: Not on file  Occupational History   Not on file  Tobacco Use   Smoking status: Current Every Day Smoker    Packs/day: 2.00    Types: Cigars   Smokeless tobacco: Never Used  Vaping Use   Vaping Use: Never used  Substance and Sexual Activity   Alcohol use: Yes    Alcohol/week: 0.0 standard drinks    Comment: occ.    Drug use: Yes    Types: Marijuana   Sexual activity: Yes  Other Topics Concern   Not on file    Social History Narrative   Not on file   Social Determinants of Health   Financial Resource Strain:    Difficulty of Paying Living Expenses: Not on file  Food Insecurity:    Worried About Running Out of Food in the Last Year: Not on file   Ran Out of Food in the Last Year: Not on file  Transportation Needs:    Lack of Transportation (Medical): Not on file   Lack of Transportation (Non-Medical): Not on file  Physical Activity:    Days of Exercise per Week: Not on file   Minutes of Exercise per Session: Not on file  Stress:    Feeling of Stress : Not on file  Social Connections:    Frequency of Communication with Friends and Family: Not on file   Frequency of Social Gatherings with Friends and Family: Not on file   Attends Religious Services: Not on file   Active Member of Clubs or Organizations: Not on file   Attends Banker Meetings: Not on file   Marital Status: Not on file   Review of Systems - See HPI.  All other ROS are negative.  BP 110/80    Pulse 84    Temp 98.4 F (36.9 C) (Temporal)    Resp 16  Ht 5\' 10"  (1.778 m)    Wt 165 lb (74.8 kg)    SpO2 99%    BMI 23.68 kg/m   Physical Exam Vitals reviewed.  Constitutional:      Appearance: Normal appearance.  HENT:     Head: Normocephalic and atraumatic.     Right Ear: Tympanic membrane normal.     Left Ear: Tympanic membrane normal.  Eyes:     Conjunctiva/sclera: Conjunctivae normal.     Pupils: Pupils are equal, round, and reactive to light.  Cardiovascular:     Rate and Rhythm: Normal rate and regular rhythm.     Pulses: Normal pulses.  Pulmonary:     Effort: Pulmonary effort is normal.     Breath sounds: Normal breath sounds.  Abdominal:     General: Bowel sounds are normal.     Palpations: Abdomen is soft.  Musculoskeletal:     Cervical back: Neck supple.  Neurological:     General: No focal deficit present.     Mental Status: He is alert and oriented to person, place, and  time.  Psychiatric:        Mood and Affect: Mood normal.     Recent Results (from the past 2160 hour(s))  Cytology (oral, anal, urethral) ancillary only     Status: Abnormal   Collection Time: 02/13/20 10:51 AM  Result Value Ref Range   Neisseria Gonorrhea Negative    Chlamydia Negative    Trichomonas Positive (A)    Comment Normal Reference Range Trichomonas - Negative    Comment Normal Reference Ranger Chlamydia - Negative    Comment      Normal Reference Range Neisseria Gonorrhea - Negative    Assessment/Plan: 1. Mild intermittent asthma, unspecified whether complicated Stable.  No recent exacerbation.  Continue to monitor.  Albuterol as needed.  Smoking cessation encouraged.  He declines intervention today.  2. Intermittent abdominal pain Rare symptoms.  Asymptomatic at present.  No alarm signs or symptoms present.  Examination unremarkable.  Discussed proper diet.  Start daily probiotic.  If symptoms recurring would recommend further evaluation with gastroenterology.  3. Marijuana use Occasional use.  Nondaily.  Discussed how this does interact with certain medications and is still considered illegal in her area so recommend refrain from use.  Will monitor.  This visit occurred during the SARS-CoV-2 public health emergency.  Safety protocols were in place, including screening questions prior to the visit, additional usage of staff PPE, and extensive cleaning of exam room while observing appropriate contact time as indicated for disinfecting solutions.     02/15/20, PA-C

## 2020-08-24 ENCOUNTER — Other Ambulatory Visit: Payer: Self-pay

## 2020-08-24 ENCOUNTER — Ambulatory Visit (HOSPITAL_COMMUNITY)
Admission: EM | Admit: 2020-08-24 | Discharge: 2020-08-24 | Disposition: A | Discharge status: Home / Self Care | Payer: BC Managed Care – PPO | Attending: Family Medicine | Admitting: Family Medicine

## 2020-08-24 ENCOUNTER — Encounter (HOSPITAL_COMMUNITY): Payer: Self-pay

## 2020-08-24 DIAGNOSIS — Z202 Contact with and (suspected) exposure to infections with a predominantly sexual mode of transmission: Secondary | ICD-10-CM | POA: Insufficient documentation

## 2020-08-24 MED ORDER — CEFTRIAXONE SODIUM 500 MG IJ SOLR
INTRAMUSCULAR | Status: AC
  Filled 2020-08-24: qty 500

## 2020-08-24 MED ORDER — DOXYCYCLINE HYCLATE 100 MG PO CAPS
100.0000 mg | ORAL_CAPSULE | Freq: Two times a day (BID) | ORAL | 0 refills | Status: DC
Start: 2020-08-24 — End: 2021-07-21

## 2020-08-24 MED ORDER — LIDOCAINE HCL (PF) 1 % IJ SOLN
INTRAMUSCULAR | Status: AC
  Filled 2020-08-24: qty 2

## 2020-08-24 MED ORDER — CEFTRIAXONE SODIUM 500 MG IJ SOLR
500.0000 mg | Freq: Once | INTRAMUSCULAR | Status: AC
  Administered 2020-08-24: 500 mg via INTRAMUSCULAR

## 2020-08-24 NOTE — Discharge Instructions (Signed)

## 2020-08-24 NOTE — ED Triage Notes (Signed)
Pt presents for STD Testing after partner was treated for gonorrhea; pt states he is not having any symptoms.

## 2020-08-25 LAB — CYTOLOGY, (ORAL, ANAL, URETHRAL) ANCILLARY ONLY
Chlamydia: POSITIVE — AB
Comment: NEGATIVE
Comment: NEGATIVE
Comment: NORMAL
Neisseria Gonorrhea: NEGATIVE
Trichomonas: POSITIVE — AB

## 2020-08-25 NOTE — ED Provider Notes (Signed)
[facility]   [id number] 08/24/20 Arrival Time: 1831  ASSESSMENT & PLAN:  1. Exposure to STD       Discharge Instructions     You have been given the following today for treatment of suspected gonorrhea and/or chlamydia:  cefTRIAXone (ROCEPHIN) injection 500 mg  Please pick up your prescription for doxycycline 100 mg and begin taking twice daily for the next seven (7) days.  Even though we have treated you today, we have sent testing for sexually transmitted infections. We will notify you of any positive results once they are received. If required, we will prescribe any medications you might need.  Please refrain from all sexual activity for at least the next seven days.     Pending: Labs Reviewed  CYTOLOGY, (ORAL, ANAL, URETHRAL) ANCILLARY ONLY    Will notify of any positive results. Instructed to refrain from sexual activity for at least seven days.  Reviewed expectations re: course of current medical issues. Questions answered. Outlined signs and symptoms indicating need for more acute intervention. Patient verbalized understanding. After Visit Summary given.   SUBJECTIVE:  Johnny Cooke is a 45 y.o. male who reports possible STD exp; thinks gonorrhea. No current symptoms. Denies: urinary frequency, dysuria and gross hematuria. Afebrile. No abdominal or pelvic pain. No n/v. No rashes or lesions.   OBJECTIVE:  Vitals:   08/24/20 1917  BP: 124/80  Pulse: 82  Resp: 18  Temp: 98.6 F (37 C)  TempSrc: Oral  SpO2: 100%     General appearance: alert, cooperative, appears stated age and no distress Throat: lips, mucosa, and tongue normal; teeth and gums normal Lungs: unlabored respirations; speaks full sentences without difficulty Back: no CVA tenderness; FROM at waist Abdomen: soft, non-tender GU: normal appearing genitalia Skin: warm and dry Psychological: alert and cooperative; normal mood and affect.  Chart review: Results for orders placed  or performed during the hospital encounter of 02/13/20  Cytology (oral, anal, urethral) ancillary only  Result Value Ref Range   Neisseria Gonorrhea Negative    Chlamydia Negative    Trichomonas Positive (A)    Comment Normal Reference Range Trichomonas - Negative    Comment Normal Reference Ranger Chlamydia - Negative    Comment      Normal Reference Range Neisseria Gonorrhea - Negative    Labs Reviewed  CYTOLOGY, (ORAL, ANAL, URETHRAL) ANCILLARY ONLY    No Known Allergies  Past Medical History:  Diagnosis Date  . Asthma   . Trichimoniasis    Family History  Problem Relation Age of Onset  . Healthy Mother   . Cancer Father        unknown type  . Cancer Paternal Grandfather        unknown type -- thinks prostate cancer  . Healthy Son   . Asthma Son    Social History   Socioeconomic History  . Marital status: Single    Spouse name: Not on file  . Number of children: Not on file  . Years of education: Not on file  . Highest education level: Not on file  Occupational History  . Not on file  Tobacco Use  . Smoking status: Current Every Day Smoker    Packs/day: 2.00    Types: Cigars  . Smokeless tobacco: Never Used  Vaping Use  . Vaping Use: Never used  Substance and Sexual Activity  . Alcohol use: Yes    Alcohol/week: 0.0 standard drinks    Comment: occ.   . Drug use: Yes  Types: Marijuana  . Sexual activity: Yes  Other Topics Concern  . Not on file  Social History Narrative  . Not on file   Social Determinants of Health   Financial Resource Strain: Not on file  Food Insecurity: Not on file  Transportation Needs: Not on file  Physical Activity: Not on file  Stress: Not on file  Social Connections: Not on file  Intimate Partner Violence: Not on file          Mardella Layman, MD 08/25/20 [phone]

## 2020-08-26 ENCOUNTER — Telehealth (HOSPITAL_COMMUNITY): Payer: Self-pay | Admitting: Emergency Medicine

## 2020-08-26 MED ORDER — METRONIDAZOLE 500 MG PO TABS
500.0000 mg | ORAL_TABLET | Freq: Two times a day (BID) | ORAL | 0 refills | Status: DC
Start: 2020-08-26 — End: 2021-02-13

## 2020-11-13 ENCOUNTER — Ambulatory Visit (HOSPITAL_COMMUNITY)
Admission: EM | Admit: 2020-11-13 | Discharge: 2020-11-13 | Disposition: A | Discharge status: Home / Self Care | Payer: BC Managed Care – PPO | Attending: Family Medicine | Admitting: Family Medicine

## 2020-11-13 ENCOUNTER — Encounter (HOSPITAL_COMMUNITY): Payer: Self-pay

## 2020-11-13 ENCOUNTER — Other Ambulatory Visit: Payer: Self-pay

## 2020-11-13 DIAGNOSIS — S39012A Strain of muscle, fascia and tendon of lower back, initial encounter: Secondary | ICD-10-CM

## 2020-11-13 MED ORDER — DEXAMETHASONE SODIUM PHOSPHATE 10 MG/ML IJ SOLN
INTRAMUSCULAR | Status: AC
  Filled 2020-11-13: qty 1

## 2020-11-13 MED ORDER — DEXAMETHASONE SODIUM PHOSPHATE 10 MG/ML IJ SOLN
10.0000 mg | Freq: Once | INTRAMUSCULAR | Status: AC
  Administered 2020-11-13: 10 mg via INTRAMUSCULAR

## 2020-11-13 MED ORDER — IBUPROFEN 800 MG PO TABS
800.0000 mg | ORAL_TABLET | Freq: Three times a day (TID) | ORAL | 0 refills | Status: DC
Start: 2020-11-13 — End: 2021-07-21

## 2020-11-13 MED ORDER — CYCLOBENZAPRINE HCL 10 MG PO TABS
10.0000 mg | ORAL_TABLET | Freq: Three times a day (TID) | ORAL | 0 refills | Status: DC | PRN
Start: 2020-11-13 — End: 2021-07-21

## 2020-11-13 NOTE — ED Triage Notes (Signed)
Pt presents with recurrent back pain from heavy lifting at work.

## 2020-11-13 NOTE — ED Provider Notes (Signed)
[facility]    CSN: [id number] Arrival date & time: 11/13/20  1123      History   Chief Complaint Chief Complaint  Patient presents with  . Back Pain    HPI Johnny Cooke is a 45 y.o. male.   Presenting today with acute on chronic bilateral lower back soreness.  States he does a lot of heavy lifting for work and has been in several car since in the past which have led him to have these "flares "every so often with heavy physical work days.  Any sort of bending or twisting at the waist causes pulling muscular pains bilateral low back.  Denies weakness, radiation down legs, numbness, tingling, bowel or bladder continence, fevers.  Typically comes in for a steroid injection and states this states it off for quite some time.  Has been taking 800 mg ibuprofen and muscle relaxers at home with mild benefit.     Past Medical History:  Diagnosis Date  . Asthma   . Trichimoniasis     Patient Active Problem List   Diagnosis Date Noted  . Mild intermittent asthma 05/07/2020    History reviewed. No pertinent surgical history.     Home Medications    Prior to Admission medications   Medication Sig Start Date End Date Taking? Authorizing Provider  cyclobenzaprine (FLEXERIL) 10 MG tablet Take 1 tablet (10 mg total) by mouth 3 (three) times daily as needed for muscle spasms. Do not drink alcohol or drive while taking this medication.  May cause drowsiness. 11/13/20  Yes Particia Nearing, PA-C  ibuprofen (ADVIL) 800 MG tablet Take 1 tablet (800 mg total) by mouth 3 (three) times daily. 11/13/20  Yes Particia Nearing, PA-C  albuterol (VENTOLIN HFA) 108 (90 Base) MCG/ACT inhaler Inhale 1-2 puffs into the lungs every 6 (six) hours as needed for wheezing or shortness of breath.    [provider]  doxycycline (VIBRAMYCIN) 100 MG capsule Take 1 capsule (100 mg total) by mouth 2 (two) times daily. 08/24/20   Mardella Layman, MD  metroNIDAZOLE (FLAGYL) 500 MG tablet  Take 1 tablet (500 mg total) by mouth 2 (two) times daily. 08/26/20   LampteyBritta Mccreedy, MD    Family History Family History  Problem Relation Age of Onset  . Healthy Mother   . Cancer Father        unknown type  . Cancer Paternal Grandfather        unknown type -- thinks prostate cancer  . Healthy Son   . Asthma Son     Social History Social History   Tobacco Use  . Smoking status: Current Every Day Smoker    Packs/day: 2.00    Types: Cigars  . Smokeless tobacco: Never Used  Vaping Use  . Vaping Use: Never used  Substance Use Topics  . Alcohol use: Yes    Alcohol/week: 0.0 standard drinks    Comment: occ.   . Drug use: Yes    Types: Marijuana     Allergies   Patient has no known allergies.   Review of Systems Review of Systems Per HPI Physical Exam Triage Vital Signs ED Triage Vitals  Enc Vitals Group     BP 11/13/20 1201 128/88     Pulse Rate 11/13/20 1201 77     Resp 11/13/20 1201 18     Temp 11/13/20 1201 98.8 F (37.1 C)     Temp Source 11/13/20 1201 Oral     SpO2 11/13/20 1201  100 %     Weight --      Height --      Head Circumference --      Peak Flow --      Pain Score 11/13/20 1200 8     Pain Loc --      Pain Edu? --      Excl. in GC? --    No data found.  Updated Vital Signs BP 128/88 (BP Location: Left Arm)   Pulse 77   Temp 98.8 F (37.1 C) (Oral)   Resp 18   SpO2 100%   Visual Acuity Right Eye Distance:   Left Eye Distance:   Bilateral Distance:    Right Eye Near:   Left Eye Near:    Bilateral Near:     Physical Exam Vitals and nursing note reviewed.  Constitutional:      Appearance: Normal appearance.  HENT:     Head: Atraumatic.  Eyes:     Extraocular Movements: Extraocular movements intact.     Conjunctiva/sclera: Conjunctivae normal.  Cardiovascular:     Rate and Rhythm: Normal rate and regular rhythm.     Pulses: Normal pulses.  Pulmonary:     Effort: Pulmonary effort is normal.     Breath sounds: Normal  breath sounds.  Abdominal:     General: Bowel sounds are normal. There is no distension.     Palpations: Abdomen is soft.     Tenderness: There is no abdominal tenderness. There is no right CVA tenderness, left CVA tenderness or guarding.  Musculoskeletal:        General: Tenderness present. Normal range of motion.     Cervical back: Normal range of motion and neck supple.     Comments: Mild bilateral lumbar musculature tender to palpation Negative straight leg raise bilaterally No midline spinal tenderness palpation diffusely  Skin:    General: Skin is warm and dry.  Neurological:     General: No focal deficit present.     Mental Status: He is oriented to person, place, and time.     Motor: No weakness.     Gait: Gait normal.     Comments: Bilateral lower extremities neurovascularly intact  Psychiatric:        Mood and Affect: Mood normal.        Thought Content: Thought content normal.        Judgment: Judgment normal.     UC Treatments / Results  Labs (all labs ordered are listed, but only abnormal results are displayed) Labs Reviewed - No data to display  EKG   Radiology No results found.  Procedures Procedures (including critical care time)  Medications Ordered in UC Medications  dexamethasone (DECADRON) injection 10 mg (10 mg Intramuscular Given 11/13/20 1227)    Initial Impression / Assessment and Plan / UC Course  I have reviewed the triage vital signs and the nursing notes.  Pertinent labs & imaging results that were available during my care of the patient were reviewed by me and considered in my medical decision making (see chart for details).     Consistent with lumbar strain.  IM Decadron given in clinic, ibuprofen and Flexeril sent to pharmacy.  Work note given for rest.  Follow-up with sports med if not resolving. Final Clinical Impressions(s) / UC Diagnoses   Final diagnoses:  Strain of lumbar region, initial encounter   Discharge Instructions    None    ED Prescriptions    Medication Sig Dispense Auth.  Provider   cyclobenzaprine (FLEXERIL) 10 MG tablet Take 1 tablet (10 mg total) by mouth 3 (three) times daily as needed for muscle spasms. Do not drink alcohol or drive while taking this medication.  May cause drowsiness. 15 tablet Particia Nearing, [place]   ibuprofen (ADVIL) 800 MG tablet Take 1 tablet (800 mg total) by mouth 3 (three) times daily. 21 tablet Particia Nearing, [place]     PDMP not reviewed this encounter.   Particia Nearing, [place] 11/13/20 1240

## 2021-02-13 ENCOUNTER — Encounter (HOSPITAL_COMMUNITY): Payer: Self-pay

## 2021-02-13 ENCOUNTER — Ambulatory Visit (HOSPITAL_COMMUNITY)
Admission: EM | Admit: 2021-02-13 | Discharge: 2021-02-13 | Disposition: A | Discharge status: Home / Self Care | Payer: BC Managed Care – PPO | Attending: Family Medicine | Admitting: Family Medicine

## 2021-02-13 ENCOUNTER — Other Ambulatory Visit: Payer: Self-pay

## 2021-02-13 DIAGNOSIS — Z202 Contact with and (suspected) exposure to infections with a predominantly sexual mode of transmission: Secondary | ICD-10-CM

## 2021-02-13 DIAGNOSIS — M67431 Ganglion, right wrist: Secondary | ICD-10-CM | POA: Diagnosis not present

## 2021-02-13 MED ORDER — PREDNISONE 20 MG PO TABS
40.0000 mg | ORAL_TABLET | Freq: Every day | ORAL | 0 refills | Status: DC
Start: 2021-02-13 — End: 2022-05-07

## 2021-02-13 MED ORDER — METRONIDAZOLE 500 MG PO TABS: 2000.0000 mg | ORAL_TABLET | Freq: Once | ORAL | 0 refills | Status: AC

## 2021-02-13 NOTE — ED Provider Notes (Signed)
[facility]    CSN: [id number] Arrival date & time: 02/13/21  1654      History   Chief Complaint Chief Complaint  Patient presents with   Wrist Pain   Exposure to STD    HPI Johnny Cooke is a 45 y.o. male.   Patient presenting today with 2-week history of right wrist pain and a firm bump on the lateral portion of the wrist.  States the bump is getting larger and he is now having shooting pain up into the forearm at times, particularly with movement of the wrist.  Denies injury to the area, redness, warmth, fevers, numbness or tingling into the fingers.  Has not tried anything so far for symptoms.  He also states that his sexual partner is positive for trichomonas so he is requesting treatment.  He is asymptomatic at this time.   Past Medical History:  Diagnosis Date   Asthma    Trichimoniasis     Patient Active Problem List   Diagnosis Date Noted   Mild intermittent asthma 05/07/2020    History reviewed. No pertinent surgical history.     Home Medications    Prior to Admission medications   Medication Sig Start Date End Date Taking? Authorizing Provider  predniSONE (DELTASONE) 20 MG tablet Take 2 tablets (40 mg total) by mouth daily with breakfast. 02/13/21  Yes Particia Nearing, PA-C  albuterol (VENTOLIN HFA) 108 (90 Base) MCG/ACT inhaler Inhale 1-2 puffs into the lungs every 6 (six) hours as needed for wheezing or shortness of breath.    [provider]  cyclobenzaprine (FLEXERIL) 10 MG tablet Take 1 tablet (10 mg total) by mouth 3 (three) times daily as needed for muscle spasms. Do not drink alcohol or drive while taking this medication.  May cause drowsiness. 11/13/20   Particia Nearing, PA-C  doxycycline (VIBRAMYCIN) 100 MG capsule Take 1 capsule (100 mg total) by mouth 2 (two) times daily. 08/24/20   Mardella Layman, MD  ibuprofen (ADVIL) 800 MG tablet Take 1 tablet (800 mg total) by mouth 3 (three) times daily. 11/13/20   Particia Nearing, PA-C  metroNIDAZOLE (FLAGYL) 500 MG tablet Take 4 tablets (2,000 mg total) by mouth once for 1 dose. 02/13/21 02/13/21  Particia Nearing, PA-C    Family History Family History  Problem Relation Age of Onset   Healthy Mother    Cancer Father        unknown type   Cancer Paternal Grandfather        unknown type -- thinks prostate cancer   Healthy Son    Asthma Son     Social History Social History   Tobacco Use   Smoking status: Every Day    Packs/day: 2.00    Types: Cigars, Cigarettes   Smokeless tobacco: Never  Vaping Use   Vaping Use: Never used  Substance Use Topics   Alcohol use: Yes    Alcohol/week: 0.0 standard drinks    Comment: occ.    Drug use: Yes    Types: Marijuana     Allergies   Patient has no known allergies.   Review of Systems Review of Systems Per HPI  Physical Exam Triage Vital Signs ED Triage Vitals  Enc Vitals Group     BP 02/13/21 1707 133/82     Pulse Rate 02/13/21 1707 83     Resp 02/13/21 1707 16     Temp 02/13/21 1707 98.4 F (36.9 C)     Temp  Source 02/13/21 1707 Oral     SpO2 02/13/21 1707 98 %     Weight --      Height --      Head Circumference --      Peak Flow --      Pain Score 02/13/21 1705 9     Pain Loc --      Pain Edu? --      Excl. in GC? --    No data found.  Updated Vital Signs BP 133/82 (BP Location: Right Arm)   Pulse 83   Temp 98.4 F (36.9 C) (Oral)   Resp 16   SpO2 98%   Visual Acuity Right Eye Distance:   Left Eye Distance:   Bilateral Distance:    Right Eye Near:   Left Eye Near:    Bilateral Near:     Physical Exam Vitals and nursing note reviewed.  Constitutional:      Appearance: Normal appearance.  HENT:     Head: Atraumatic.     Mouth/Throat:     Mouth: Mucous membranes are moist.     Pharynx: Oropharynx is clear.  Eyes:     Extraocular Movements: Extraocular movements intact.     Conjunctiva/sclera: Conjunctivae normal.  Cardiovascular:     Rate and  Rhythm: Normal rate and regular rhythm.  Pulmonary:     Effort: Pulmonary effort is normal.     Breath sounds: Normal breath sounds.  Abdominal:     General: Bowel sounds are normal. There is no distension.     Palpations: Abdomen is soft.     Tenderness: There is no abdominal tenderness. There is no guarding.  Musculoskeletal:        General: Tenderness present. Normal range of motion.     Cervical back: Normal range of motion and neck supple.     Comments: Ganglion cyst to the radial volar side of the right wrist.  Tender to palpation.  Range of motion all 5 fingers and grip strength full and intact  Skin:    General: Skin is warm and dry.     Findings: No erythema.  Neurological:     General: No focal deficit present.     Mental Status: He is oriented to person, place, and time.     Comments: At this time, right upper extremity neurovascular intact  Psychiatric:        Mood and Affect: Mood normal.        Thought Content: Thought content normal.        Judgment: Judgment normal.     UC Treatments / Results  Labs (all labs ordered are listed, but only abnormal results are displayed) Labs Reviewed - No data to display  EKG   Radiology No results found.  Procedures Procedures (including critical care time)  Medications Ordered in UC Medications - No data to display  Initial Impression / Assessment and Plan / UC Course  I have reviewed the triage vital signs and the nursing notes.  Pertinent labs & imaging results that were available during my care of the patient were reviewed by me and considered in my medical decision making (see chart for details).     Mass of right wrist consistent with a ganglion cyst, likely beginning to irritate the radial nerve given its size and location.  We will treat with prednisone while awaiting a consultation with orthopedics for further management.  Discussed compresses to the area and other supportive home care.  We will treat  with 2  g of Flagyl for known exposure to trichomonas.  Discussed abstinence for at least 7 days posttreatment to ensure clearance.  Safe sexual practices reviewed.  Final Clinical Impressions(s) / UC Diagnoses   Final diagnoses:  Ganglion cyst of volar aspect of right wrist  Exposure to trichomonas   Discharge Instructions   None    ED Prescriptions     Medication Sig Dispense Auth. Provider   metroNIDAZOLE (FLAGYL) 500 MG tablet Take 4 tablets (2,000 mg total) by mouth once for 1 dose. 4 tablet Particia Nearing, [place]   predniSONE (DELTASONE) 20 MG tablet Take 2 tablets (40 mg total) by mouth daily with breakfast. 10 tablet Particia Nearing, [place]      PDMP not reviewed this encounter.   Particia Nearing, [place] 02/13/21 1731

## 2021-02-13 NOTE — ED Triage Notes (Signed)
Pt reports right wrist pain and bump in the right wrist x 2 weeks.   Pt reports STD's test. Pt reports sexual partner tested positive for Trichomonas.

## 2021-07-21 ENCOUNTER — Ambulatory Visit (HOSPITAL_COMMUNITY)
Admission: EM | Admit: 2021-07-21 | Discharge: 2021-07-21 | Disposition: A | Discharge status: Home / Self Care | Payer: BC Managed Care – PPO | Attending: Family Medicine | Admitting: Family Medicine

## 2021-07-21 ENCOUNTER — Other Ambulatory Visit: Payer: Self-pay

## 2021-07-21 ENCOUNTER — Encounter (HOSPITAL_COMMUNITY): Payer: Self-pay | Admitting: Emergency Medicine

## 2021-07-21 DIAGNOSIS — Z711 Person with feared health complaint in whom no diagnosis is made: Secondary | ICD-10-CM | POA: Insufficient documentation

## 2021-07-21 DIAGNOSIS — M545 Low back pain, unspecified: Secondary | ICD-10-CM | POA: Diagnosis not present

## 2021-07-21 DIAGNOSIS — Z113 Encounter for screening for infections with a predominantly sexual mode of transmission: Secondary | ICD-10-CM

## 2021-07-21 MED ORDER — CEFTRIAXONE SODIUM 500 MG IJ SOLR
500.0000 mg | Freq: Once | INTRAMUSCULAR | Status: AC
  Administered 2021-07-21: 500 mg via INTRAMUSCULAR

## 2021-07-21 MED ORDER — DOXYCYCLINE HYCLATE 100 MG PO CAPS: 100.0000 mg | ORAL_CAPSULE | Freq: Two times a day (BID) | ORAL | 0 refills | Status: DC

## 2021-07-21 MED ORDER — IBUPROFEN 800 MG PO TABS: 800.0000 mg | ORAL_TABLET | Freq: Three times a day (TID) | ORAL | 0 refills | Status: DC

## 2021-07-21 MED ORDER — CEFTRIAXONE SODIUM 500 MG IJ SOLR
INTRAMUSCULAR | Status: AC
  Filled 2021-07-21: qty 500

## 2021-07-21 MED ORDER — LIDOCAINE HCL (PF) 1 % IJ SOLN
INTRAMUSCULAR | Status: AC
  Filled 2021-07-21: qty 2

## 2021-07-21 NOTE — Discharge Instructions (Addendum)

## 2021-07-21 NOTE — ED Provider Notes (Signed)
[facility]   [id number] 07/21/21 Arrival Time: 1551  ASSESSMENT & PLAN:  1. Acute bilateral low back pain without sciatica   2. Concern about STD in male without diagnosis       Discharge Instructions      You have been given the following today for treatment of suspected gonorrhea and/or chlamydia:  cefTRIAXone (ROCEPHIN) injection 500 mg  Please pick up your prescription for doxycycline 100 mg and begin taking twice daily for the next seven (7) days.  Even though we have treated you today, we have sent testing for sexually transmitted infections. We will notify you of any positive results once they are received. If required, we will prescribe any medications you might need.  Please refrain from all sexual activity for at least the next seven days.     Pending: Labs Reviewed  CYTOLOGY, (ORAL, ANAL, URETHRAL) ANCILLARY ONLY    Will notify of any positive results. Instructed to refrain from sexual activity for at least seven days.  Reviewed expectations re: course of current medical issues. Questions answered. Outlined signs and symptoms indicating need for more acute intervention. Patient verbalized understanding. After Visit Summary given.   SUBJECTIVE:  Johnny Cooke is a 46 y.o. male who presents with complaint of LBP. H/O gonorrhea/chlamydia with exact symptoms. Abrupt onset; today. No penile discharge. Afebrile. No abdominal or pelvic pain. No n/v. No rashes or lesions. OTC treatment: none.  OBJECTIVE:  Vitals:   07/21/21 1621  BP: 115/79  Pulse: 85  Resp: 18  Temp: 98.5 F (36.9 C)  TempSrc: Oral  SpO2: 96%     General appearance: alert, cooperative, appears stated age and no distress Throat: lips, mucosa, and tongue normal; teeth and gums normal Lungs: unlabored respirations; speaks full sentences without difficulty Back: no CVA tenderness; FROM at waist Abdomen: soft, non-tender GU: normal appearing genitalia Skin: warm and  dry Psychological: alert and cooperative; normal mood and affect.   Labs Reviewed  CYTOLOGY, (ORAL, ANAL, URETHRAL) ANCILLARY ONLY    No Known Allergies  Past Medical History:  Diagnosis Date   Asthma    Trichimoniasis    Family History  Problem Relation Age of Onset   Healthy Mother    Cancer Father        unknown type   Cancer Paternal Grandfather        unknown type -- thinks prostate cancer   Healthy Son    Asthma Son    Social History   Socioeconomic History   Marital status: Single    Spouse name: Not on file   Number of children: Not on file   Years of education: Not on file   Highest education level: Not on file  Occupational History   Not on file  Tobacco Use   Smoking status: Every Day    Packs/day: 2.00    Types: Cigars, Cigarettes   Smokeless tobacco: Never  Vaping Use   Vaping Use: Never used  Substance and Sexual Activity   Alcohol use: Yes    Alcohol/week: 0.0 standard drinks    Comment: occ.    Drug use: Yes    Types: Marijuana   Sexual activity: Yes  Other Topics Concern   Not on file  Social History Narrative   Not on file   Social Determinants of Health   Financial Resource Strain: Not on file  Food Insecurity: Not on file  Transportation Needs: Not on file  Physical Activity: Not on file  Stress: Not on file  Social Connections: Not on file  Intimate Partner Violence: Not on file           Mardella Layman, MD 07/21/21 [phone]

## 2021-07-21 NOTE — ED Triage Notes (Signed)
Lower back pain started early this morning.   Patient reports the last time he had lower back pain, he was given an injection for an std.  Denies penile discharge

## 2021-07-22 ENCOUNTER — Telehealth (HOSPITAL_COMMUNITY): Payer: Self-pay | Admitting: Emergency Medicine

## 2021-07-22 LAB — CYTOLOGY, (ORAL, ANAL, URETHRAL) ANCILLARY ONLY
Chlamydia: NEGATIVE
Comment: NEGATIVE
Comment: NEGATIVE
Comment: NORMAL
Neisseria Gonorrhea: NEGATIVE
Trichomonas: POSITIVE — AB

## 2021-07-22 MED ORDER — METRONIDAZOLE 500 MG PO TABS: 2000.0000 mg | ORAL_TABLET | Freq: Once | ORAL | 0 refills | Status: AC

## 2022-02-20 ENCOUNTER — Ambulatory Visit (HOSPITAL_COMMUNITY)
Admission: EM | Admit: 2022-02-20 | Discharge: 2022-02-20 | Disposition: A | Discharge status: Home / Self Care | Payer: BC Managed Care – PPO | Attending: Emergency Medicine | Admitting: Emergency Medicine

## 2022-02-20 ENCOUNTER — Encounter (HOSPITAL_COMMUNITY): Payer: Self-pay | Admitting: *Deleted

## 2022-02-20 DIAGNOSIS — U071 COVID-19: Secondary | ICD-10-CM

## 2022-02-20 DIAGNOSIS — R051 Acute cough: Secondary | ICD-10-CM

## 2022-02-20 MED ORDER — GUAIFENESIN ER 600 MG PO TB12: 600.0000 mg | ORAL_TABLET | Freq: Two times a day (BID) | ORAL | 0 refills | Status: AC

## 2022-02-20 MED ORDER — BENZONATATE 100 MG PO CAPS: 100.0000 mg | ORAL_CAPSULE | Freq: Three times a day (TID) | ORAL | 0 refills | Status: AC | PRN

## 2022-02-20 NOTE — Discharge Instructions (Addendum)
I recommend taking mucinex (guaifenesin) twice daily. This will help with congestion and cough.  You can take the cough medicine up to three times daily as needed.  Please go to the emergency department if symptoms worsen.

## 2022-02-20 NOTE — ED Triage Notes (Signed)
Patient states that he took COVID test yesterday at home and it was positive he would like to discuss meds for this and get a note for work . Sx started last Thursday after a concert. He hasn't taken any OTC meds for relief.

## 2022-02-20 NOTE — ED Provider Notes (Signed)
[facility]    CSN: [id number] Arrival date & time: 02/20/22  1227      History   Chief Complaint Chief Complaint  Patient presents with   Covid Positive    HPI Johnny Cooke is a 46 y.o. male.  Presents with a positive COVID test last night. Reports on Friday night he developed congestion, headache, body aches.  Some sore throat.  Had some chills on Saturday night.  1 episode of vomiting.  Reports cough began yesterday with continued congestion. Cough and congestion are the lingering symptoms, overall he feels good.  Was wondering about medication to take.  Denies fever, shortness of breath, chest pain, abdominal pain.  Was at a concert last week and believes his exposure occurred there  Past Medical History:  Diagnosis Date   Asthma    Trichimoniasis     Patient Active Problem List   Diagnosis Date Noted   Mild intermittent asthma 05/07/2020    History reviewed. No pertinent surgical history.    Home Medications    Prior to Admission medications   Medication Sig Start Date End Date Taking? Authorizing Provider  benzonatate (TESSALON) 100 MG capsule Take 1 capsule (100 mg total) by mouth 3 (three) times daily as needed for up to 5 days for cough. 02/20/22 02/25/22 Yes Noel Rodier, Lurena Joiner, PA-C  guaiFENesin (MUCINEX) 600 MG 12 hr tablet Take 1 tablet (600 mg total) by mouth 2 (two) times daily for 5 days. 02/20/22 02/25/22 Yes Ziyad Dyar, Lurena Joiner, PA-C  albuterol (VENTOLIN HFA) 108 (90 Base) MCG/ACT inhaler Inhale 1-2 puffs into the lungs every 6 (six) hours as needed for wheezing or shortness of breath.    [provider]  doxycycline (VIBRAMYCIN) 100 MG capsule Take 1 capsule (100 mg total) by mouth 2 (two) times daily. 07/21/21   Mardella Layman, MD  ibuprofen (ADVIL) 800 MG tablet Take 1 tablet (800 mg total) by mouth 3 (three) times daily with meals. 07/21/21   Mardella Layman, MD  predniSONE (DELTASONE) 20 MG tablet Take 2 tablets (40 mg total) by mouth daily  with breakfast. Patient not taking: Reported on 07/21/2021 02/13/21   Particia Nearing, PA-C    Family History Family History  Problem Relation Age of Onset   Healthy Mother    Cancer Father        unknown type   Cancer Paternal Grandfather        unknown type -- thinks prostate cancer   Healthy Son    Asthma Son     Social History Social History   Tobacco Use   Smoking status: Every Day    Packs/day: 2.00    Types: Cigars, Cigarettes   Smokeless tobacco: Never  Vaping Use   Vaping Use: Never used  Substance Use Topics   Alcohol use: Yes    Alcohol/week: 0.0 standard drinks of alcohol    Comment: occ.    Drug use: Yes    Types: Marijuana     Allergies   Patient has no known allergies.   Review of Systems Review of Systems Per HPI  Physical Exam Triage Vital Signs ED Triage Vitals  Enc Vitals Group     BP 02/20/22 1257 (!) 145/103     Pulse Rate 02/20/22 1257 73     Resp 02/20/22 1257 18     Temp 02/20/22 1257 97.8 F (36.6 C)     Temp Source 02/20/22 1257 Oral     SpO2 02/20/22 1257 99 %  Weight --      Height --      Head Circumference --      Peak Flow --      Pain Score 02/20/22 1256 0     Pain Loc --      Pain Edu? --      Excl. in GC? --    No data found.  Updated Vital Signs BP (!) 145/103 (BP Location: Left Arm)   Pulse 73   Temp 97.8 F (36.6 C) (Oral)   Resp 18   SpO2 99%    Physical Exam Vitals and nursing note reviewed.  Constitutional:      General: He is not in acute distress. HENT:     Nose: Congestion present.     Mouth/Throat:     Mouth: Mucous membranes are moist.     Pharynx: Uvula midline. No posterior oropharyngeal erythema.     Tonsils: No tonsillar exudate or tonsillar abscesses.  Eyes:     Conjunctiva/sclera: Conjunctivae normal.     Pupils: Pupils are equal, round, and reactive to light.  Cardiovascular:     Rate and Rhythm: Normal rate and regular rhythm.     Heart sounds: Normal heart sounds.   Pulmonary:     Effort: Pulmonary effort is normal. No respiratory distress.     Breath sounds: Normal breath sounds. No wheezing.  Musculoskeletal:     Cervical back: Normal range of motion.  Lymphadenopathy:     Cervical: No cervical adenopathy.  Neurological:     Mental Status: He is alert and oriented to person, place, and time.      UC Treatments / Results  Labs (all labs ordered are listed, but only abnormal results are displayed) Labs Reviewed - No data to display  EKG  Radiology No results found.  Procedures Procedures   Medications Ordered in UC Medications - No data to display  Initial Impression / Assessment and Plan / UC Course  I have reviewed the triage vital signs and the nursing notes.  Pertinent labs & imaging results that were available during my care of the patient were reviewed by me and considered in my medical decision making (see chart for details).  Overall well-appearing.  Patient does not want to try antivirals.  He would rather try congestion/cough medicine.  Recommend Mucinex twice daily.  Could also try Tessalon 3 times daily as needed.  He feels most of his symptoms are behind him.  Work note provided.  Return precautions were discussed, patient agrees to plan  Final Clinical Impressions(s) / UC Diagnoses   Final diagnoses:  Positive self-administered antigen test for COVID-19  Acute cough     Discharge Instructions      I recommend taking mucinex (guaifenesin) twice daily. This will help with congestion and cough.  You can take the cough medicine up to three times daily as needed.  Please go to the emergency department if symptoms worsen.    ED Prescriptions     Medication Sig Dispense Auth. Provider   guaiFENesin (MUCINEX) 600 MG 12 hr tablet Take 1 tablet (600 mg total) by mouth 2 (two) times daily for 5 days. 10 tablet Dontasia Miranda, PA-C   benzonatate (TESSALON) 100 MG capsule Take 1 capsule (100 mg total) by mouth 3  (three) times daily as needed for up to 5 days for cough. 15 capsule Nino Amano, Lurena Joiner, PA-C      PDMP not reviewed this encounter.   Macala Baldonado, Lurena Joiner, PA-C 02/20/22 1350

## 2022-05-07 ENCOUNTER — Ambulatory Visit (HOSPITAL_COMMUNITY)
Admission: EM | Admit: 2022-05-07 | Discharge: 2022-05-07 | Disposition: A | Discharge status: Home / Self Care | Payer: BC Managed Care – PPO | Attending: Family Medicine | Admitting: Family Medicine

## 2022-05-07 ENCOUNTER — Encounter (HOSPITAL_COMMUNITY): Payer: Self-pay | Admitting: *Deleted

## 2022-05-07 ENCOUNTER — Other Ambulatory Visit: Payer: Self-pay

## 2022-05-07 DIAGNOSIS — R369 Urethral discharge, unspecified: Secondary | ICD-10-CM | POA: Insufficient documentation

## 2022-05-07 MED ORDER — DOXYCYCLINE HYCLATE 100 MG PO CAPS: 100.0000 mg | ORAL_CAPSULE | Freq: Two times a day (BID) | ORAL | 0 refills | Status: AC

## 2022-05-07 NOTE — ED Provider Notes (Addendum)
[facility]    CSN: [id number] Arrival date & time: 05/07/22  1009      History   Chief Complaint Chief Complaint  Patient presents with   Hematuria    HPI Johnny Cooke is a 46 y.o. male.    Hematuria   Here for bloody discharge from his penis.  On October 30 he noted some discomfort of his penis when he had an erection.  It would go away once he emptied his bladder.  This happened a couple of times with an erection.  Then yesterday he had an erection had that discomfort and then when he touched his penis a lot of blood came out.  Since then he has had some dysuria and has noticed some pink discharge in his underwear.  No fever or chills.  Earlier in the week he had pain in his left abdomen that went away after he stopped drinking sodas.  Otherwise no abdominal pain or flank pain noted.   He notes that some years ago he had some blood in his semen and was seen by urology.  He does have a family history of prostate cancer  No rash or itching and no swelling of the genitalia.  No erythema there either.   Past Medical History:  Diagnosis Date   Asthma    Trichimoniasis     Patient Active Problem List   Diagnosis Date Noted   Mild intermittent asthma 05/07/2020    History reviewed. No pertinent surgical history.     Home Medications    Prior to Admission medications   Medication Sig Start Date End Date Taking? Authorizing Provider  albuterol (VENTOLIN HFA) 108 (90 Base) MCG/ACT inhaler Inhale 1-2 puffs into the lungs every 6 (six) hours as needed for wheezing or shortness of breath.    [provider]    Family History Family History  Problem Relation Age of Onset   Healthy Mother    Cancer Father        unknown type   Cancer Paternal Grandfather        unknown type -- thinks prostate cancer   Healthy Son    Asthma Son     Social History Social History   Tobacco Use   Smoking status: Every Day    Packs/day: 2.00    Types:  Cigars, Cigarettes   Smokeless tobacco: Never  Vaping Use   Vaping Use: Never used  Substance Use Topics   Alcohol use: Yes    Alcohol/week: 0.0 standard drinks of alcohol    Comment: occ.    Drug use: Yes    Types: Marijuana     Allergies   Patient has no known allergies.   Review of Systems Review of Systems  Genitourinary:  Positive for hematuria.     Physical Exam Triage Vital Signs ED Triage Vitals  Enc Vitals Group     BP 05/07/22 1037 120/82     Pulse Rate 05/07/22 1037 82     Resp 05/07/22 1037 16     Temp 05/07/22 1037 98.6 F (37 C)     Temp src --      SpO2 05/07/22 1037 97 %     Weight --      Height --      Head Circumference --      Peak Flow --      Pain Score 05/07/22 1035 0     Pain Loc --      Pain Edu? --  Excl. in GC? --    No data found.  Updated Vital Signs BP 120/82   Pulse 82   Temp 98.6 F (37 C)   Resp 16   SpO2 97%   Visual Acuity Right Eye Distance:   Left Eye Distance:   Bilateral Distance:    Right Eye Near:   Left Eye Near:    Bilateral Near:     Physical Exam Vitals reviewed.  Constitutional:      General: He is not in acute distress.    Appearance: He is not ill-appearing, toxic-appearing or diaphoretic.  HENT:     Mouth/Throat:     Mouth: Mucous membranes are moist.  Cardiovascular:     Rate and Rhythm: Normal rate and regular rhythm.  Pulmonary:     Effort: Pulmonary effort is normal.     Breath sounds: Normal breath sounds.  Abdominal:     Palpations: Abdomen is soft.     Tenderness: There is no abdominal tenderness.  Skin:    Coloration: Skin is not jaundiced or pale.  Neurological:     Mental Status: He is alert and oriented to person, place, and time.  Psychiatric:        Behavior: Behavior normal.      UC Treatments / Results  Labs (all labs ordered are listed, but only abnormal results are displayed)   EKG   Radiology No results found.  Procedures Procedures (including  critical care time)  Medications Ordered in UC Medications - No data to display  Initial Impression / Assessment and Plan / UC Course  I have reviewed the triage vital signs and the nursing notes.  Pertinent labs & imaging results that were available during my care of the patient were reviewed by me and considered in my medical decision making (see chart for details).        UA shows large amount of blood and WBC; also protein (UA result given to me from machine as electronic result delayed in crossing over to Epic) urine culture sent, and urethral swab done for completeness. He is given contact info for urology  I am going to go ahead and treat with doxy in the short term; discussed that blood is unusual with STI Final Clinical Impressions(s) / UC Diagnoses   Final diagnoses:  Abnormal penile discharge, with blood     Discharge Instructions      The urinalysis had blood and white blood cells, unclear if this is a sign of urinary infection. Urine culture is sent to help sort it all out, to see if bacteria infection grows in the urine.  Staff will notify you of any positives on the swab  Please call the urology office and make an appointment for follow-up to make sure this gets better.  If not improving or continuing will need more evaluation  Take doxycycline 100 mg --1 capsule 2 times daily for 7 days; this is an antibiotic to treat poss inflammation in the urethra or urinary infection      ED Prescriptions     Medication Sig Dispense Auth. Provider   doxycycline (VIBRAMYCIN) 100 MG capsule Take 1 capsule (100 mg total) by mouth 2 (two) times daily for 7 days. 14 capsule Marlinda Mike, Janace Aris, MD      PDMP not reviewed this encounter.   Zenia Resides, MD 05/07/22 1122    Zenia Resides, MD 06/12/22 1052

## 2022-05-07 NOTE — ED Triage Notes (Signed)
Pt reports hematuria yesterday.

## 2022-05-07 NOTE — Discharge Instructions (Addendum)
The urinalysis had blood and white blood cells, unclear if this is a sign of urinary infection. Urine culture is sent to help sort it all out, to see if bacteria infection grows in the urine.  Staff will notify you of any positives on the swab  Please call the urology office and make an appointment for follow-up to make sure this gets better.  If not improving or continuing will need more evaluation  Take doxycycline 100 mg --1 capsule 2 times daily for 7 days; this is an antibiotic to treat poss inflammation in the urethra or urinary infection

## 2022-05-08 LAB — URINE CULTURE: Culture: <10000 — AB

## 2022-05-08 LAB — CYTOLOGY, (ORAL, ANAL, URETHRAL) ANCILLARY ONLY
Chlamydia: NEGATIVE
Comment: NEGATIVE
Comment: NEGATIVE
Comment: NORMAL
Neisseria Gonorrhea: POSITIVE — AB
Trichomonas: POSITIVE — AB

## 2022-05-08 LAB — POCT URINALYSIS DIPSTICK, ED / UC
Glucose, UA: NEGATIVE mg/dL
Glucose, UA: NEGATIVE mg/dL
Ketones, ur: 40 mg/dL — AB
Ketones, ur: 40 mg/dL — AB
Nitrite: NEGATIVE
Nitrite: NEGATIVE
Protein, ur: 100 mg/dL — AB
Protein, ur: 100 mg/dL — AB
Specific Gravity, Urine: 1.025 (ref 1.005–1.030)
Specific Gravity, Urine: 1.025 (ref 1.005–1.030)
Urobilinogen, UA: 0.2 mg/dL (ref 0.0–1.0)
Urobilinogen, UA: 1 mg/dL (ref 0.0–1.0)
pH: 6 (ref 5.0–8.0)
pH: 6 (ref 5.0–8.0)

## 2022-05-09 ENCOUNTER — Telehealth (HOSPITAL_COMMUNITY): Payer: Self-pay | Admitting: Emergency Medicine

## 2022-05-09 ENCOUNTER — Ambulatory Visit (HOSPITAL_COMMUNITY)
Admission: EM | Admit: 2022-05-09 | Discharge: 2022-05-09 | Disposition: A | Discharge status: Home / Self Care | Payer: BC Managed Care – PPO | Attending: Internal Medicine | Admitting: Internal Medicine

## 2022-05-09 DIAGNOSIS — A549 Gonococcal infection, unspecified: Secondary | ICD-10-CM | POA: Diagnosis not present

## 2022-05-09 MED ORDER — CEFTRIAXONE SODIUM 500 MG IJ SOLR
INTRAMUSCULAR | Status: AC
  Filled 2022-05-09: qty 500

## 2022-05-09 MED ORDER — LIDOCAINE HCL (PF) 1 % IJ SOLN
INTRAMUSCULAR | Status: AC
  Filled 2022-05-09: qty 2

## 2022-05-09 MED ORDER — CEFTRIAXONE SODIUM 1 G IJ SOLR
0.5000 g | Freq: Once | INTRAMUSCULAR | Status: AC
  Administered 2022-05-09: 0.5 g via INTRAMUSCULAR

## 2022-05-09 MED ORDER — METRONIDAZOLE 500 MG PO TABS: 2000.0000 mg | ORAL_TABLET | Freq: Once | ORAL | 0 refills | Status: AC

## 2022-05-09 NOTE — Telephone Encounter (Signed)
Per protocol, patient will need treatment with IM Rocephin 500mg  for positive Gonorrhea.  Will also need treatment with Flagyl.   Contacted patient by phone.  Verified identity using two identifiers.  Provided positive result.  Reviewed safe sex practices, notifying partners, and refraining from sexual activities for 7 days from time of treatment.  Patient verified understanding, all questions answered.   [facility]HS notified Verified pharmacy, prescription sent

## 2022-05-09 NOTE — ED Notes (Signed)
Pt came into clinic today for rocephin 500mg  to treat gonorrhea. Pt tolerated injection well.

## 2022-05-11 ENCOUNTER — Encounter (HOSPITAL_COMMUNITY): Payer: Self-pay | Admitting: Emergency Medicine

## 2022-05-11 ENCOUNTER — Emergency Department (HOSPITAL_COMMUNITY)
Admission: EM | Admit: 2022-05-11 | Discharge: 2022-05-11 | Disposition: A | Discharge status: Home / Self Care | Payer: BC Managed Care – PPO | Attending: Emergency Medicine | Admitting: Emergency Medicine

## 2022-05-11 ENCOUNTER — Other Ambulatory Visit: Payer: Self-pay

## 2022-05-11 DIAGNOSIS — F17210 Nicotine dependence, cigarettes, uncomplicated: Secondary | ICD-10-CM | POA: Diagnosis not present

## 2022-05-11 DIAGNOSIS — R300 Dysuria: Secondary | ICD-10-CM | POA: Diagnosis present

## 2022-05-11 DIAGNOSIS — N390 Urinary tract infection, site not specified: Secondary | ICD-10-CM | POA: Diagnosis not present

## 2022-05-11 DIAGNOSIS — Z7951 Long term (current) use of inhaled steroids: Secondary | ICD-10-CM | POA: Diagnosis not present

## 2022-05-11 DIAGNOSIS — J4520 Mild intermittent asthma, uncomplicated: Secondary | ICD-10-CM | POA: Diagnosis not present

## 2022-05-11 DIAGNOSIS — R361 Hematospermia: Secondary | ICD-10-CM | POA: Diagnosis not present

## 2022-05-11 LAB — URINALYSIS, ROUTINE W REFLEX MICROSCOPIC
Bilirubin Urine: NEGATIVE
Glucose, UA: NEGATIVE mg/dL
Hgb urine dipstick: NEGATIVE
Ketones, ur: NEGATIVE mg/dL
Nitrite: NEGATIVE
Protein, ur: 30 mg/dL — AB
Specific Gravity, Urine: 1.023 (ref 1.005–1.030)
pH: 6 (ref 5.0–8.0)

## 2022-05-11 MED ORDER — CEFDINIR 300 MG PO CAPS: 300.0000 mg | ORAL_CAPSULE | Freq: Two times a day (BID) | ORAL | 0 refills | Status: AC

## 2022-05-11 NOTE — Discharge Instructions (Signed)
We evaluated you for blood in your semen.  Your urinary test showed signs of a possible urine infection.  I have prescribed you an antibiotic to take for this.  We have also sent testing for STI such as gonorrhea and chlamydia.  This test will not return today, please follow-up on your MyChart app to see the results of this testing and return if it comes back positive for treatment.  Please follow-up with urologist for your symptoms.  Please call for an appointment.  Please return if you develop any fevers, difficulty urinating, inability to urinate, pain in your sides, nausea or vomiting, or any other worsening symptoms.

## 2022-05-11 NOTE — ED Triage Notes (Signed)
Pt reports blood in semen. Reports he only feels pain / has blood coming out when he ejaculates. Pt reports bright red blood.

## 2022-05-11 NOTE — ED Provider Notes (Signed)
[place] DEPT Provider Note  CSN: [id number] Arrival date & time: 05/11/22 [id number]  Chief Complaint(s) Blood in semen   HPI Anna Guldner is a 46 y.o. male without significant past medical history presenting to the emergency department with hematospermia.  He reports that he has sometimes painful erections, over the past week, associated with blood in his sperm.  He reports he has mild dysuria after these episodes.  He denies a change in defecation, rectal pain, urethral discharge, testicular pain.  Symptoms are mild.  He reports a similar episode in the past, saw urologist and had no clear diagnosis.   Past Medical History Past Medical History:  Diagnosis Date   Asthma    Trichimoniasis    Patient Active Problem List   Diagnosis Date Noted   Mild intermittent asthma 05/07/2020   Home Medication(s) Prior to Admission medications   Medication Sig Start Date End Date Taking? Authorizing Provider  cefdinir (OMNICEF) 300 MG capsule Take 1 capsule (300 mg total) by mouth 2 (two) times daily for 7 days. 05/11/22 05/18/22 Yes Cristie Hem, MD  albuterol (VENTOLIN HFA) 108 (90 Base) MCG/ACT inhaler Inhale 1-2 puffs into the lungs every 6 (six) hours as needed for wheezing or shortness of breath.    [provider]  doxycycline (VIBRAMYCIN) 100 MG capsule Take 1 capsule (100 mg total) by mouth 2 (two) times daily for 7 days. 05/07/22 05/14/22  Barrett Henle, MD                                                                                                                                    Past Surgical History History reviewed. No pertinent surgical history. Family History Family History  Problem Relation Age of Onset   Healthy Mother    Cancer Father        unknown type   Cancer Paternal Grandfather        unknown type -- thinks prostate cancer   Healthy Son    Asthma Son     Social History Social History   Tobacco Use   Smoking  status: Every Day    Packs/day: 2.00    Types: Cigars, Cigarettes   Smokeless tobacco: Never  Vaping Use   Vaping Use: Never used  Substance Use Topics   Alcohol use: Yes    Alcohol/week: 0.0 standard drinks of alcohol    Comment: occ.    Drug use: Yes    Types: Marijuana   Allergies Patient has no known allergies.  Review of Systems Review of Systems  All other systems reviewed and are negative.   Physical Exam Vital Signs  I have reviewed the triage vital signs BP (!) 133/99   Pulse 80   Temp 97.9 F (36.6 C)   Resp 16   SpO2 98%  Physical Exam Vitals and nursing note reviewed.  Constitutional:      General: He is  not in acute distress.    Appearance: Normal appearance.  HENT:     Head: Normocephalic and atraumatic.     Mouth/Throat:     Mouth: Mucous membranes are moist.  Eyes:     Conjunctiva/sclera: Conjunctivae normal.  Cardiovascular:     Rate and Rhythm: Normal rate.  Pulmonary:     Effort: Pulmonary effort is normal. No respiratory distress.  Abdominal:     General: Abdomen is flat.     Tenderness: There is no abdominal tenderness. There is no right CVA tenderness or left CVA tenderness.  Skin:    General: Skin is warm and dry.     Capillary Refill: Capillary refill takes less than 2 seconds.  Neurological:     General: No focal deficit present.     Mental Status: He is alert. Mental status is at baseline.  Psychiatric:        Mood and Affect: Mood normal.        Behavior: Behavior normal.     ED Results and Treatments Labs (all labs ordered are listed, but only abnormal results are displayed) Labs Reviewed  URINALYSIS, ROUTINE W REFLEX MICROSCOPIC - Abnormal; Notable for the following components:      Result Value   Protein, ur 30 (*)    Leukocytes,Ua TRACE (*)    Bacteria, UA RARE (*)    All other components within normal limits  URINE CULTURE  GC/CHLAMYDIA PROBE AMP ([place]) NOT AT [facility]                                                                                                                           Radiology No results found.  Pertinent labs & imaging results that were available during my care of the patient were reviewed by me and considered in my medical decision making (see MDM for details).  Medications Ordered in ED Medications - No data to display                                                                                                                                   Procedures Procedures  (including critical care time)  Medical Decision Making / ED Course   MDM:  46 year old male presenting to the emergency department with hematospermia.  Patient well-appearing, physical exam unremarkable.  No abdominal tenderness.  Symptoms could possibly related to urinary infection, urinalysis shows some bacteria and  leukocytes.  Will treat with cefdinir.  No evidence of pyelonephritis on physical exam.  Patient denies any testicular pain to suggest epididymitis.  Denies any rectal pain, painful defecation to suggest prostatitis.  Patient did go to urgent care already for the symptoms and had a positive gonorrhea testing, positive trichomonal testing, patient reports that he has received treatment with intramuscular antibiotics and oral antibiotics for these diagnoses.  This is probably the most likely cause of his symptoms. advised return to urology for follow-up given recurrent symptoms. Will discharge patient to home. All questions answered. Patient comfortable with plan of discharge. Return precautions discussed with patient and specified on the after visit summary.       Additional history obtained: -Additional history obtained from friend -External records from outside source obtained and reviewed including: Chart review including previous notes, labs, imaging, consultation notes including UC visit 05/07/22   Lab Tests: -I ordered, reviewed, and interpreted labs.   The pertinent results  include:   Labs Reviewed  URINALYSIS, ROUTINE W REFLEX MICROSCOPIC - Abnormal; Notable for the following components:      Result Value   Protein, ur 30 (*)    Leukocytes,Ua TRACE (*)    Bacteria, UA RARE (*)    All other components within normal limits  URINE CULTURE  GC/CHLAMYDIA PROBE AMP ([place]) NOT AT [facility]    Notable for trace bacteria/leukocytes  Medicines ordered and prescription drug management: Meds ordered this encounter  Medications   cefdinir (OMNICEF) 300 MG capsule    Sig: Take 1 capsule (300 mg total) by mouth 2 (two) times daily for 7 days.    Dispense:  14 capsule    Refill:  0    -I have reviewed the patients home medicines and have made adjustments as needed    Reevaluation: After the interventions noted above, I reevaluated the patient and found that they have improved  Co morbidities that complicate the patient evaluation  Past Medical History:  Diagnosis Date   Asthma    Trichimoniasis       Dispostion: Disposition decision including need for hospitalization was considered, and patient discharged from emergency department.    Final Clinical Impression(s) / ED Diagnoses Final diagnoses:  Hematospermia  Urinary tract infection without hematuria, site unspecified     This chart was dictated using voice recognition software.  Despite best efforts to proofread,  errors can occur which can change the documentation meaning.    Lonell Grandchild, MD 05/11/22 1148

## 2022-05-12 LAB — URINE CULTURE: Culture: NO GROWTH

## 2022-05-12 LAB — GC/CHLAMYDIA PROBE AMP (~~LOC~~) NOT AT ARMC
Chlamydia: NEGATIVE
Comment: NEGATIVE
Comment: NORMAL
Neisseria Gonorrhea: NEGATIVE

## 2022-12-19 ENCOUNTER — Other Ambulatory Visit: Payer: Self-pay

## 2022-12-19 ENCOUNTER — Encounter (HOSPITAL_COMMUNITY): Payer: Self-pay | Admitting: *Deleted

## 2022-12-19 ENCOUNTER — Ambulatory Visit (HOSPITAL_COMMUNITY)
Admission: EM | Admit: 2022-12-19 | Discharge: 2022-12-19 | Disposition: A | Discharge status: Home / Self Care | Payer: BC Managed Care – PPO | Attending: Internal Medicine | Admitting: Internal Medicine

## 2022-12-19 DIAGNOSIS — B353 Tinea pedis: Secondary | ICD-10-CM | POA: Diagnosis not present

## 2022-12-19 MED ORDER — CLOTRIMAZOLE 1 % EX CREA: TOPICAL_CREAM | CUTANEOUS | 0 refills | Status: DC

## 2022-12-19 MED ORDER — FLUCONAZOLE 100 MG PO TABS: ORAL_TABLET | ORAL | 0 refills | Status: AC

## 2022-12-19 NOTE — ED Provider Notes (Signed)
[facility]    CSN: [id number] Arrival date & time: 12/19/22  1651      History   Chief Complaint Chief Complaint  Patient presents with   Skin Problem    HPI Johnny Cooke is a 47 y.o. male.   Patient presents to urgent care for evaluation of itching and skin irritation to the interdigital space between the fourth and fifth toes of the left foot that started 1 month ago. States over the last 1-2 weeks, itching has worsened and he has noticed white drainage/coating to the skin. Denies odor to the wound. No recent trauma/injury to the wound. He used Terbinafine 3 times over the last few days without much relief. No history of diabetes or immunosuppression. No pain to the site, only itching.     Past Medical History:  Diagnosis Date   Asthma    Trichimoniasis     Patient Active Problem List   Diagnosis Date Noted   Mild intermittent asthma 05/07/2020    History reviewed. No pertinent surgical history.     Home Medications    Prior to Admission medications   Medication Sig Start Date End Date Taking? Authorizing Provider  clotrimazole (LOTRIMIN) 1 % cream Apply to affected area 2 times daily 12/19/22  Yes Sande Pickert, Donavan Burnet, FNP  fluconazole (DIFLUCAN) 100 MG tablet Take 2 tablets (200 mg total) by mouth daily for 1 day, THEN 1 tablet (100 mg total) daily for 5 days. 12/19/22 12/25/22 Yes Carlisle Beers, FNP  albuterol (VENTOLIN HFA) 108 (90 Base) MCG/ACT inhaler Inhale 1-2 puffs into the lungs every 6 (six) hours as needed for wheezing or shortness of breath.    [provider]    Family History Family History  Problem Relation Age of Onset   Healthy Mother    Cancer Father        unknown type   Cancer Paternal Grandfather        unknown type -- thinks prostate cancer   Healthy Son    Asthma Son     Social History Social History   Tobacco Use   Smoking status: Every Day    Packs/day: 2    Types: Cigars, Cigarettes    Smokeless tobacco: Never  Vaping Use   Vaping Use: Never used  Substance Use Topics   Alcohol use: Yes    Alcohol/week: 0.0 standard drinks of alcohol    Comment: occ.    Drug use: Yes    Types: Marijuana     Allergies   Patient has no known allergies.   Review of Systems Review of Systems Per HPI  Physical Exam Triage Vital Signs ED Triage Vitals  Enc Vitals Group     BP 12/19/22 1706 (!) 130/90     Pulse Rate 12/19/22 1706 77     Resp 12/19/22 1706 18     Temp 12/19/22 1706 97.8 F (36.6 C)     Temp src --      SpO2 12/19/22 1706 97 %     Weight --      Height --      Head Circumference --      Peak Flow --      Pain Score 12/19/22 1705 0     Pain Loc --      Pain Edu? --      Excl. in GC? --    No data found.  Updated Vital Signs BP (!) 130/90   Pulse 77  Temp 97.8 F (36.6 C)   Resp 18   SpO2 97%   Visual Acuity Right Eye Distance:   Left Eye Distance:   Bilateral Distance:    Right Eye Near:   Left Eye Near:    Bilateral Near:     Physical Exam Vitals and nursing note reviewed.  Constitutional:      Appearance: He is not ill-appearing or toxic-appearing.  HENT:     Head: Normocephalic and atraumatic.     Right Ear: Hearing and external ear normal.     Left Ear: Hearing and external ear normal.     Nose: Nose normal.     Mouth/Throat:     Lips: Pink.  Eyes:     General: Lids are normal. Vision grossly intact. Gaze aligned appropriately.     Extraocular Movements: Extraocular movements intact.     Conjunctiva/sclera: Conjunctivae normal.  Pulmonary:     Effort: Pulmonary effort is normal.  Musculoskeletal:     Cervical back: Neck supple.  Feet:     Left foot:     Skin integrity: Skin breakdown present.     Comments: See image below of the interdigital space between the fourth and fifth digits of the left foot. Cap refill less than 3. +2 left DP pulse. Sensation and strength intact distally.  No underlying erythema or soft tissue  induration. Skin:    General: Skin is warm and dry.     Capillary Refill: Capillary refill takes less than 2 seconds.     Findings: No rash.  Neurological:     General: No focal deficit present.     Mental Status: He is alert and oriented to person, place, and time. Mental status is at baseline.     Cranial Nerves: No dysarthria or facial asymmetry.  Psychiatric:        Mood and Affect: Mood normal.        Speech: Speech normal.        Behavior: Behavior normal.        Thought Content: Thought content normal.        Judgment: Judgment normal.      UC Treatments / Results  Labs (all labs ordered are listed, but only abnormal results are displayed) Labs Reviewed - No data to display  EKG   Radiology No results found.  Procedures Procedures (including critical care time)  Medications Ordered in UC Medications - No data to display  Initial Impression / Assessment and Plan / UC Course  I have reviewed the triage vital signs and the nursing notes.  Pertinent labs & imaging results that were available during my care of the patient were reviewed by me and considered in my medical decision making (see chart for details).   1.  Tinea pedis of left foot Presentation is consistent with acute fungal infection of the left foot. No signs of secondary bacterial infection. Diflucan 200mg  today, then 100mg  daily for the next 5 days. Clotrimazole BID for 7 days. Follow-up with [facility] in 3-5 days as needed. Infection return precautions discussed.   Discussed red flag signs and symptoms of worsening condition,when to call the PCP office, return to urgent care, and when to seek higher level of care in the emergency department. Counseled patient regarding appropriate use of medications and potential side effects for all medications recommended or prescribed today. Patient verbalizes understanding and agreement with plan. Discharged in stable condition.     Final Clinical  Impressions(s) / UC Diagnoses  Final diagnoses:  Tinea pedis of left foot     Discharge Instructions      You have a fungal infection of the left foot.  Take antifungal pill diflucan 2 pills today, then 1 pill daily for the next 5 days.  Apply clotrimazole ointment to the wound twice a day for the next 7 days.  Schedule appointment with podiatry (foot doctor) listed on paperwork.  If you develop any new or worsening symptoms or do not improve in the next 2 to 3 days, please return.  If your symptoms are severe, please go to the emergency room.  Follow-up with your primary care provider for further evaluation and management of your symptoms as well as ongoing wellness visits.  I hope you feel better!     ED Prescriptions     Medication Sig Dispense Auth. Provider   fluconazole (DIFLUCAN) 100 MG tablet Take 2 tablets (200 mg total) by mouth daily for 1 day, THEN 1 tablet (100 mg total) daily for 5 days. 7 tablet Reita May M, FNP   clotrimazole (LOTRIMIN) 1 % cream Apply to affected area 2 times daily 15 g Carlisle Beers, FNP      PDMP not reviewed this encounter.   Carlisle Beers, [place] 12/19/22 1740

## 2022-12-19 NOTE — Discharge Instructions (Signed)
You have a fungal infection of the left foot.  Take antifungal pill diflucan 2 pills today, then 1 pill daily for the next 5 days.  Apply clotrimazole ointment to the wound twice a day for the next 7 days.  Schedule appointment with podiatry (foot doctor) listed on paperwork.  If you develop any new or worsening symptoms or do not improve in the next 2 to 3 days, please return.  If your symptoms are severe, please go to the emergency room.  Follow-up with your primary care provider for further evaluation and management of your symptoms as well as ongoing wellness visits.  I hope you feel better!

## 2022-12-19 NOTE — ED Triage Notes (Signed)
Pt has skin irritation between Lt 4th toe and 5th toe for one month. Pt reports the skin itches.

## 2024-06-02 ENCOUNTER — Encounter (HOSPITAL_COMMUNITY): Payer: Self-pay

## 2024-06-02 ENCOUNTER — Ambulatory Visit (HOSPITAL_COMMUNITY): Admission: EM | Admit: 2024-06-02 | Discharge: 2024-06-02 | Disposition: A | Discharge status: Home / Self Care

## 2024-06-02 ENCOUNTER — Ambulatory Visit (INDEPENDENT_AMBULATORY_CARE_PROVIDER_SITE_OTHER)

## 2024-06-02 DIAGNOSIS — M545 Low back pain, unspecified: Secondary | ICD-10-CM | POA: Diagnosis not present

## 2024-06-02 MED ORDER — NAPROXEN 500 MG PO TABS: 500.0000 mg | ORAL_TABLET | Freq: Two times a day (BID) | ORAL | 0 refills | Status: DC

## 2024-06-02 MED ORDER — CYCLOBENZAPRINE HCL 10 MG PO TABS: 10.0000 mg | ORAL_TABLET | Freq: Two times a day (BID) | ORAL | 0 refills | Status: DC | PRN

## 2024-06-02 NOTE — ED Triage Notes (Signed)
 Pt present with lower back pain x 3 days.  States he feels something is pressing on a nerve. Pt reports he cannot bear to bend over. Pt has tried heating pads, has not had any relief.
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
## 2024-06-02 NOTE — Discharge Instructions (Addendum)
 Your lumbar x-ray shows mild degenerative changes, but no acute fracture or malalignment. You have been prescribed cyclobenzaprine  for muscle spasms.  Please take 1 tablet by mouth twice daily as needed. You have been prescribed Naprosyn, for pain.  Please take 1 tablet by mouth twice daily.  Do not take additional NSAIDs such as aspirin, Advil /ibuprofen , Aleve/naproxen while taking this medication due to increased risk of GI bleed. You may try heat packs and/or ice packs applied for 15-20 minutes at a time 2-3 times per day. If you develop any new or worsening symptoms or if your symptoms do not improve, please return here or follow-up with your primary care provider.  If your symptoms are severe, please go to the emergency room.

## 2024-07-31 ENCOUNTER — Ambulatory Visit (INDEPENDENT_AMBULATORY_CARE_PROVIDER_SITE_OTHER)

## 2024-07-31 ENCOUNTER — Encounter (HOSPITAL_COMMUNITY): Payer: Self-pay

## 2024-07-31 ENCOUNTER — Ambulatory Visit (HOSPITAL_COMMUNITY)
Admission: EM | Admit: 2024-07-31 | Discharge: 2024-07-31 | Disposition: A | Discharge status: Home / Self Care | Attending: Family Medicine | Admitting: Family Medicine

## 2024-07-31 DIAGNOSIS — M25532 Pain in left wrist: Secondary | ICD-10-CM

## 2024-07-31 DIAGNOSIS — M25522 Pain in left elbow: Secondary | ICD-10-CM

## 2024-07-31 MED ORDER — IBUPROFEN 800 MG PO TABS
ORAL_TABLET | ORAL | Status: AC
  Filled 2024-07-31: qty 1

## 2024-07-31 MED ORDER — IBUPROFEN 800 MG PO TABS
800.0000 mg | ORAL_TABLET | Freq: Once | ORAL | Status: AC
  Administered 2024-07-31: 800 mg via ORAL

## 2024-07-31 MED ORDER — CYCLOBENZAPRINE HCL 10 MG PO TABS: 10.0000 mg | ORAL_TABLET | Freq: Two times a day (BID) | ORAL | 1 refills | Status: AC | PRN

## 2024-07-31 MED ORDER — HYDROCODONE-ACETAMINOPHEN 5-325 MG PO TABS: 1.0000 | ORAL_TABLET | Freq: Four times a day (QID) | ORAL | 0 refills | Status: AC | PRN
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
## 2024-07-31 NOTE — Discharge Instructions (Signed)
 Be aware, you have been prescribed pain medications that may cause drowsiness. While taking this medication, do not take any other medications containing acetaminophen (Tylenol). Do not combine with alcohol or recreational drugs. Please do not drive, operate heavy machinery, or take part in activities that require making important decisions while on this medication as your judgement may be clouded.

## 2024-08-06 ENCOUNTER — Emergency Department (HOSPITAL_COMMUNITY)

## 2024-08-06 ENCOUNTER — Emergency Department (HOSPITAL_COMMUNITY)
Admission: EM | Admit: 2024-08-06 | Discharge: 2024-08-06 | Disposition: A | Discharge status: Home / Self Care | Attending: Emergency Medicine | Admitting: Emergency Medicine

## 2024-08-06 DIAGNOSIS — M25532 Pain in left wrist: Secondary | ICD-10-CM | POA: Insufficient documentation

## 2024-08-06 DIAGNOSIS — M25522 Pain in left elbow: Secondary | ICD-10-CM | POA: Insufficient documentation

## 2024-08-06 DIAGNOSIS — M79602 Pain in left arm: Secondary | ICD-10-CM | POA: Insufficient documentation

## 2024-08-06 DIAGNOSIS — J45909 Unspecified asthma, uncomplicated: Secondary | ICD-10-CM | POA: Insufficient documentation

## 2024-08-06 MED ORDER — IBUPROFEN 200 MG PO TABS
600.0000 mg | ORAL_TABLET | Freq: Once | ORAL | Status: AC
  Administered 2024-08-06: 600 mg via ORAL
  Filled 2024-08-06: qty 3
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
## 2024-08-06 NOTE — Progress Notes (Signed)
 Orthopedic Tech Progress Note Patient Details:  Johnny Cooke [birth year] [id number]  Ortho Devices Type of Ortho Device: Ace wrap, Cotton web roll, Short arm splint Ortho Device/Splint Location: left short arm splint applied Ortho Device/Splint Interventions: Ordered, Application, Adjustment   Post Interventions Patient Tolerated: Well Instructions Provided: Adjustment of device, Care of device  [name] 08/06/2024, 5:46 PM
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
